# Patient Record
Sex: Male | Born: 1960 | Race: White | Hispanic: No | Smoking: Never smoker
Health system: Southern US, Community
[De-identification: ages and names within clinical notes are randomized; demographics above are authoritative.]

## PROBLEM LIST (undated history)

## (undated) DIAGNOSIS — E222 Syndrome of inappropriate secretion of antidiuretic hormone: Secondary | ICD-10-CM

## (undated) DIAGNOSIS — E119 Type 2 diabetes mellitus without complications: Secondary | ICD-10-CM

## (undated) DIAGNOSIS — I10 Essential (primary) hypertension: Secondary | ICD-10-CM

## (undated) DIAGNOSIS — N319 Neuromuscular dysfunction of bladder, unspecified: Secondary | ICD-10-CM

## (undated) DIAGNOSIS — E7800 Pure hypercholesterolemia, unspecified: Secondary | ICD-10-CM

## (undated) DIAGNOSIS — Z978 Presence of other specified devices: Secondary | ICD-10-CM

## (undated) DIAGNOSIS — G459 Transient cerebral ischemic attack, unspecified: Secondary | ICD-10-CM

## (undated) DIAGNOSIS — E46 Unspecified protein-calorie malnutrition: Secondary | ICD-10-CM

## (undated) DIAGNOSIS — G8220 Paraplegia, unspecified: Secondary | ICD-10-CM

---

## 2019-07-23 ENCOUNTER — Inpatient Hospital Stay
Admit: 2019-07-23 | Discharge: 2019-08-06 | Disposition: A | Discharge status: Other Acute Inpatient Hospital | Payer: Medicare HMO | Source: Other Acute Inpatient Hospital | Attending: Internal Medicine | Admitting: Internal Medicine

## 2019-07-24 LAB — COMPREHENSIVE METABOLIC PANEL
ALT: 12 U/L (ref 0–44)
AST: 19 U/L (ref 15–41)
Albumin: 1.4 g/dL — ABNORMAL LOW (ref 3.5–5.0)
Alkaline Phosphatase: 81 U/L (ref 38–126)
Anion gap: 7 (ref 5–15)
BUN: 10 mg/dL (ref 6–20)
CO2: 26 mmol/L (ref 22–32)
Calcium: 8.1 mg/dL — ABNORMAL LOW (ref 8.9–10.3)
Chloride: 104 mmol/L (ref 98–111)
Creatinine, Ser: 0.67 mg/dL (ref 0.61–1.24)
GFR calc Af Amer: >60 mL/min (ref >60)
GFR calc non Af Amer: >60 mL/min (ref >60)
Glucose, Bld: 140 mg/dL — ABNORMAL HIGH (ref 70–99)
Potassium: 3.4 mmol/L — ABNORMAL LOW (ref 3.5–5.1)
Sodium: 137 mmol/L (ref 135–145)
Total Bilirubin: 0.6 mg/dL (ref 0.3–1.2)
Total Protein: 5.3 g/dL — ABNORMAL LOW (ref 6.5–8.1)

## 2019-07-24 LAB — PROTIME-INR
INR: 1.1 (ref 0.8–1.2)
Prothrombin Time: 14 seconds (ref 11.4–15.2)

## 2019-07-24 MED ORDER — PRAVASTATIN SODIUM 40 MG PO TABS
40.00 | ORAL_TABLET | ORAL | Status: DC
Start: 2019-07-23 — End: 2019-07-24

## 2019-07-24 MED ORDER — INSULIN LISPRO 100 UNIT/ML ~~LOC~~ SOLN
1.00 | SUBCUTANEOUS | Status: DC
Start: 2019-07-23 — End: 2019-07-24

## 2019-07-24 MED ORDER — SODIUM CHLORIDE 0.9 % IV SOLN
10.00 | INTRAVENOUS | Status: DC
Start: ? — End: 2019-07-24

## 2019-07-24 MED ORDER — INSULIN LISPRO 100 UNIT/ML ~~LOC~~ SOLN
1.00 | SUBCUTANEOUS | Status: DC
Start: ? — End: 2019-07-24

## 2019-07-24 MED ORDER — PROMETHAZINE HCL 25 MG/ML IJ SOLN
12.50 | INTRAMUSCULAR | Status: DC
Start: ? — End: 2019-07-24

## 2019-07-24 MED ORDER — MORPHINE SULFATE (PF) 2 MG/ML IV SOLN
2.00 | INTRAVENOUS | Status: DC
Start: ? — End: 2019-07-24

## 2019-07-24 MED ORDER — CITRATE OF MAGNESIA PO SOLN
150.00 | ORAL | Status: DC
Start: ? — End: 2019-07-24

## 2019-07-24 MED ORDER — GENERIC EXTERNAL MEDICATION
Status: DC
Start: 2019-07-23 — End: 2019-07-24

## 2019-07-24 MED ORDER — GENERIC EXTERNAL MEDICATION
Status: DC
Start: ? — End: 2019-07-24

## 2019-07-24 MED ORDER — MAGNESIUM HYDROXIDE 400 MG/5ML PO SUSP
30.00 | ORAL | Status: DC
Start: ? — End: 2019-07-24

## 2019-07-24 MED ORDER — INSULIN GLARGINE 100 UNIT/ML ~~LOC~~ SOLN
1.00 | SUBCUTANEOUS | Status: DC
Start: 2019-07-23 — End: 2019-07-24

## 2019-07-24 MED ORDER — POLYETHYLENE GLYCOL 3350 17 GM/SCOOP PO POWD
17.00 | ORAL | Status: DC
Start: 2019-07-24 — End: 2019-07-24

## 2019-07-24 MED ORDER — GUAIFENESIN 100 MG/5ML PO SYRP
200.00 | ORAL_SOLUTION | ORAL | Status: DC
Start: ? — End: 2019-07-24

## 2019-07-24 MED ORDER — ASPIRIN 81 MG PO CHEW
81.00 | CHEWABLE_TABLET | ORAL | Status: DC
Start: 2019-07-24 — End: 2019-07-24

## 2019-07-24 MED ORDER — CLOTRIMAZOLE 1 % EX CREA
TOPICAL_CREAM | CUTANEOUS | Status: DC
Start: 2019-07-23 — End: 2019-07-24

## 2019-07-24 MED ORDER — ACETAMINOPHEN 325 MG PO TABS
650.00 | ORAL_TABLET | ORAL | Status: DC
Start: ? — End: 2019-07-24

## 2019-07-24 MED ORDER — GENERIC EXTERNAL MEDICATION
3.38 | Status: DC
Start: 2019-07-23 — End: 2019-07-24

## 2019-07-25 MED ORDER — GENERIC EXTERNAL MEDICATION
Status: DC
Start: ? — End: 2019-07-25

## 2019-07-26 LAB — CBC
HCT: 26.3 % — ABNORMAL LOW (ref 39.0–52.0)
Hemoglobin: 8.1 g/dL — ABNORMAL LOW (ref 13.0–17.0)
MCH: 26.4 pg (ref 26.0–34.0)
MCHC: 30.8 g/dL (ref 30.0–36.0)
MCV: 85.7 fL (ref 80.0–100.0)
Platelets: 172 10*3/uL (ref 150–400)
RBC: 3.07 MIL/uL — ABNORMAL LOW (ref 4.22–5.81)
RDW: 16.7 % — ABNORMAL HIGH (ref 11.5–15.5)
WBC: 4.8 10*3/uL (ref 4.0–10.5)
nRBC: 0 % (ref 0.0–0.2)

## 2019-07-27 MED ORDER — GENERIC EXTERNAL MEDICATION
Status: DC
Start: ? — End: 2019-07-27

## 2019-07-28 LAB — BASIC METABOLIC PANEL
Anion gap: 9 (ref 5–15)
BUN: 8 mg/dL (ref 6–20)
CO2: 28 mmol/L (ref 22–32)
Calcium: 8.2 mg/dL — ABNORMAL LOW (ref 8.9–10.3)
Chloride: 99 mmol/L (ref 98–111)
Creatinine, Ser: 0.54 mg/dL — ABNORMAL LOW (ref 0.61–1.24)
GFR calc Af Amer: >60 mL/min (ref >60)
GFR calc non Af Amer: >60 mL/min (ref >60)
Glucose, Bld: 276 mg/dL — ABNORMAL HIGH (ref 70–99)
Potassium: 3.4 mmol/L — ABNORMAL LOW (ref 3.5–5.1)
Sodium: 136 mmol/L (ref 135–145)

## 2019-07-28 LAB — HEMOGLOBIN A1C
Hgb A1c MFr Bld: 6.5 % — ABNORMAL HIGH (ref 4.8–5.6)
Mean Plasma Glucose: 139.85 mg/dL

## 2019-07-28 LAB — CBC
HCT: 26.9 % — ABNORMAL LOW (ref 39.0–52.0)
Hemoglobin: 8.4 g/dL — ABNORMAL LOW (ref 13.0–17.0)
MCH: 27.4 pg (ref 26.0–34.0)
MCHC: 31.2 g/dL (ref 30.0–36.0)
MCV: 87.6 fL (ref 80.0–100.0)
Platelets: 220 10*3/uL (ref 150–400)
RBC: 3.07 MIL/uL — ABNORMAL LOW (ref 4.22–5.81)
RDW: 16.7 % — ABNORMAL HIGH (ref 11.5–15.5)
WBC: 4.6 10*3/uL (ref 4.0–10.5)
nRBC: 0 % (ref 0.0–0.2)

## 2019-07-29 LAB — POTASSIUM: Potassium: 4.5 mmol/L (ref 3.5–5.1)

## 2019-07-29 MED ORDER — GENERIC EXTERNAL MEDICATION
Status: DC
Start: ? — End: 2019-07-29

## 2019-07-31 MED ORDER — GENERIC EXTERNAL MEDICATION
Status: DC
Start: ? — End: 2019-07-31

## 2019-08-02 MED ORDER — GENERIC EXTERNAL MEDICATION
Status: DC
Start: ? — End: 2019-08-02

## 2019-08-04 MED ORDER — GENERIC EXTERNAL MEDICATION
Status: DC
Start: ? — End: 2019-08-04

## 2019-08-05 LAB — CBC
HCT: 30.5 % — ABNORMAL LOW (ref 39.0–52.0)
Hemoglobin: 9.4 g/dL — ABNORMAL LOW (ref 13.0–17.0)
MCH: 27 pg (ref 26.0–34.0)
MCHC: 30.8 g/dL (ref 30.0–36.0)
MCV: 87.6 fL (ref 80.0–100.0)
Platelets: 312 10*3/uL (ref 150–400)
RBC: 3.48 MIL/uL — ABNORMAL LOW (ref 4.22–5.81)
RDW: 16.9 % — ABNORMAL HIGH (ref 11.5–15.5)
WBC: 7.2 10*3/uL (ref 4.0–10.5)
nRBC: 0 % (ref 0.0–0.2)

## 2019-08-05 LAB — HEMOGLOBIN AND HEMATOCRIT, BLOOD
HCT: 25.2 % — ABNORMAL LOW (ref 39.0–52.0)
Hemoglobin: 7.6 g/dL — ABNORMAL LOW (ref 13.0–17.0)

## 2019-08-05 LAB — ABO/RH: ABO/RH(D): O POS

## 2019-08-05 LAB — CULTURE, BLOOD (ROUTINE X 2)
Culture: NO GROWTH
Culture: NO GROWTH
Special Requests: ADEQUATE

## 2019-08-05 LAB — PROTIME-INR
INR: 1 (ref 0.8–1.2)
Prothrombin Time: 13.1 seconds (ref 11.4–15.2)

## 2019-08-05 LAB — PREPARE RBC (CROSSMATCH)

## 2019-08-06 ENCOUNTER — Inpatient Hospital Stay
Admission: RE | Admit: 2019-08-06 | Discharge: 2019-10-03 | Disposition: A | Discharge status: Skilled Nursing Facility | Payer: Medicare HMO | Source: Other Acute Inpatient Hospital | Attending: Internal Medicine | Admitting: Internal Medicine

## 2019-08-06 ENCOUNTER — Emergency Department (HOSPITAL_COMMUNITY)
Admission: EM | Admit: 2019-08-06 | Discharge: 2019-08-06 | Disposition: A | Discharge status: Home / Self Care | Payer: Medicare HMO | Attending: Emergency Medicine | Admitting: Emergency Medicine

## 2019-08-06 ENCOUNTER — Encounter (HOSPITAL_COMMUNITY): Payer: Self-pay | Admitting: Emergency Medicine

## 2019-08-06 DIAGNOSIS — L98425 Non-pressure chronic ulcer of back with muscle involvement without evidence of necrosis: Secondary | ICD-10-CM | POA: Diagnosis not present

## 2019-08-06 DIAGNOSIS — Z5189 Encounter for other specified aftercare: Secondary | ICD-10-CM | POA: Insufficient documentation

## 2019-08-06 DIAGNOSIS — D649 Anemia, unspecified: Secondary | ICD-10-CM | POA: Insufficient documentation

## 2019-08-06 DIAGNOSIS — T148XXA Other injury of unspecified body region, initial encounter: Secondary | ICD-10-CM

## 2019-08-06 DIAGNOSIS — L89154 Pressure ulcer of sacral region, stage 4: Secondary | ICD-10-CM

## 2019-08-06 DIAGNOSIS — E11622 Type 2 diabetes mellitus with other skin ulcer: Secondary | ICD-10-CM | POA: Insufficient documentation

## 2019-08-06 DIAGNOSIS — L03312 Cellulitis of back [any part except buttock]: Secondary | ICD-10-CM | POA: Insufficient documentation

## 2019-08-06 DIAGNOSIS — L02416 Cutaneous abscess of left lower limb: Secondary | ICD-10-CM

## 2019-08-06 DIAGNOSIS — L089 Local infection of the skin and subcutaneous tissue, unspecified: Secondary | ICD-10-CM

## 2019-08-06 DIAGNOSIS — Z95828 Presence of other vascular implants and grafts: Secondary | ICD-10-CM

## 2019-08-06 HISTORY — DX: Pure hypercholesterolemia, unspecified: E78.00

## 2019-08-06 HISTORY — DX: Unspecified protein-calorie malnutrition: E46

## 2019-08-06 HISTORY — DX: Syndrome of inappropriate secretion of antidiuretic hormone: E22.2

## 2019-08-06 HISTORY — DX: Paraplegia, unspecified: G82.20

## 2019-08-06 HISTORY — DX: Essential (primary) hypertension: I10

## 2019-08-06 HISTORY — DX: Neuromuscular dysfunction of bladder, unspecified: N31.9

## 2019-08-06 HISTORY — DX: Presence of other specified devices: Z97.8

## 2019-08-06 HISTORY — DX: Type 2 diabetes mellitus without complications: E11.9

## 2019-08-06 HISTORY — DX: Transient cerebral ischemic attack, unspecified: G45.9

## 2019-08-06 LAB — HEMOGLOBIN AND HEMATOCRIT, BLOOD
HCT: 23.2 % — ABNORMAL LOW (ref 39.0–52.0)
Hemoglobin: 7.2 g/dL — ABNORMAL LOW (ref 13.0–17.0)

## 2019-08-06 LAB — PREPARE RBC (CROSSMATCH)

## 2019-08-06 LAB — CBG MONITORING, ED
Glucose-Capillary: 321 mg/dL — ABNORMAL HIGH (ref 70–99)
Glucose-Capillary: 472 mg/dL — ABNORMAL HIGH (ref 70–99)

## 2019-08-06 MED ORDER — SODIUM CHLORIDE 0.9 % IV SOLN
10.0000 mL/h | Freq: Once | INTRAVENOUS | Status: AC
  Administered 2019-08-06: 17:00:00 10 mL/h via INTRAVENOUS

## 2019-08-06 MED ORDER — INSULIN ASPART 100 UNIT/ML ~~LOC~~ SOLN
12.0000 [IU] | Freq: Once | SUBCUTANEOUS | Status: AC
  Administered 2019-08-06: 19:00:00 12 [IU] via SUBCUTANEOUS

## 2019-08-06 MED ORDER — GENERIC EXTERNAL MEDICATION
Status: DC
Start: ? — End: 2019-08-06

## 2019-08-06 NOTE — ED Provider Notes (Signed)
[facility] EMERGENCY DEPARTMENT Provider Note   CSN: [id number] Arrival date & time: 08/06/19  1606     History Chief Complaint  Patient presents with  . Wound Check    Donald Graves is a 59 y.o. male obesity, paraplegia, DM2, chronic sacral decubitus ulcer complicated by soft tissue infection, abscesses, requiring surgical debridement in April 2021, sepsis treated with IV antibiotics, presenting today from his wound care clinic upstairs for bleeding issue.  He was seen in [facility] upstairs, where his packing was removed, and he immediately began having a small suspected arteriole bleed from his wound.  I spoke to the clinic physician who tells me that they attempted to cauterize the wound,and then placed 3 sutures (3-0 silk) over the wound to control the bleeding.  They laid him down to apply pressure to the site, and sent him downstairs.  He was PENDING a transfusion for 1 unit pRBC for his chronic anemia, which is being managed in the hospital, but did not receive it upstairs.  He most recently received a transfusion yesterday.  He is currently an inpatient at our hospital.  The patient denies new lightheadedness, CP, or SOB.  He is unaware of any prior issues of extensive wound bleeding but he has had a complicated history.  He is NOT on blood thinners.  HPI     Past Medical History:  Diagnosis Date  . Chronic indwelling Foley catheter   . Diabetes mellitus without complication (HCC)   . High cholesterol   . Hypertension   . Neurogenic bladder   . Paraplegia (HCC)   . Protein calorie malnutrition (HCC)   . SIADH (syndrome of inappropriate ADH production) (HCC)   . TIA (transient ischemic attack)     There are no problems to display for this patient.   History reviewed. No pertinent surgical history.     No family history on file.  Social History   Tobacco Use  . Smoking status: Never Smoker  . Smokeless tobacco: Never Used  Substance  Use Topics  . Alcohol use: Not Currently  . Drug use: Never    Home Medications Prior to Admission medications   Medication Sig Start Date End Date Taking? Authorizing Provider  ascorbic acid (VITAMIN C) 1000 MG tablet Take 1,000 mg by mouth every morning.   Yes [provider]  Bilberry 1000 MG CAPS Take 1,000 mg by mouth every morning.   Yes [provider]  clotrimazole (LOTRIMIN) 1 % cream Apply 1 application topically 2 (two) times daily. 07/23/19  Yes [provider]  cyanocobalamin 2000 MCG tablet Take 5,000 mcg by mouth every morning.   Yes [provider]  fenofibrate 160 MG tablet Take 160 mg by mouth daily. 04/11/19  Yes [provider]  Flaxseed, Linseed, (FLAX SEED OIL) 1000 MG CAPS Take 1,000 mg by mouth every morning.   Yes [provider]  glipiZIDE (GLUCOTROL) 10 MG tablet Take 10 mg by mouth daily. 04/12/19  Yes [provider]  lisinopril (ZESTRIL) 5 MG tablet Take 5 mg by mouth daily. 03/09/19  Yes [provider]  metFORMIN (GLUCOPHAGE) 1000 MG tablet Take 1,000 mg by mouth 2 (two) times daily. 05/18/19  Yes [provider]  pravastatin (PRAVACHOL) 80 MG tablet Take 80 mg by mouth at bedtime. 05/18/19  Yes [provider]  vitamin E 1000 UNIT capsule Take 1,000 Units by mouth every morning.   Yes [provider]    Allergies  Patient has no known allergies.  Review of Systems   Review of Systems  Constitutional: Negative for chills and fever.  Eyes: Negative for photophobia and visual disturbance.  Respiratory: Negative for cough and shortness of breath.   Cardiovascular: Negative for chest pain and palpitations.  Gastrointestinal: Negative for abdominal pain and vomiting.  Genitourinary: Negative for dysuria and hematuria.  Musculoskeletal: Positive for myalgias. Negative for arthralgias.  Skin: Positive for wound. Negative for pallor.  Neurological: Negative for  syncope and headaches.  All other systems reviewed and are negative.   Physical Exam Updated Vital Signs BP (!) 129/56   Pulse 94   Temp 99.6 F (37.6 C) (Oral)   Resp (!) 23   Ht 5\' 6"  (1.676 m)   Wt 110.1 kg   SpO2 100%   BMI 39.19 kg/m   Physical Exam Vitals and nursing note reviewed.  Constitutional:      Appearance: He is well-developed. He is obese.  HENT:     Head: Normocephalic and atraumatic.  Eyes:     Conjunctiva/sclera: Conjunctivae normal.  Cardiovascular:     Rate and Rhythm: Normal rate and regular rhythm.     Heart sounds: No murmur.  Pulmonary:     Effort: Pulmonary effort is normal. No respiratory distress.  Musculoskeletal:     Cervical back: Neck supple.  Skin:    General: Skin is warm and dry.     Comments: Wound as noted below, extensive region, no purulence or malodor No active bleeding from region of cauterization (sutures noted) in inferior aspect of wound  Neurological:     Mental Status: He is alert.     Top photo shows region of cauterization (after packing was removed) with sutures placed      ED Results / Procedures / Treatments   Labs (all labs ordered are listed, but only abnormal results are displayed) Labs Reviewed  CBG MONITORING, ED - Abnormal; Notable for the following components:      Result Value   Glucose-Capillary 321 (*)    All other components within normal limits  PREPARE RBC (CROSSMATCH)    EKG None  Radiology No results found.  Procedures Procedures (including critical care time)  Medications Ordered in ED Medications  0.9 %  sodium chloride infusion (10 mL/hr Intravenous New Bag/Given 08/06/19 1721)  insulin aspart (novoLOG) injection 12 Units (12 Units Subcutaneous Given 08/06/19 1833)    ED Course  I have reviewed the triage vital signs and the nursing notes.  Pertinent labs & imaging results that were available during my care of the patient were reviewed by me and considered in my medical  decision making (see chart for details).  59 yo male with complicated medical wound history as noted above, currently an inpatient at our LTAC on IV antibiotics, presenting from the wound clinic upstairs with concerns for possible arterial bleed.  Appears that the physician was able ot achieve hemostasis with cautery, pressure and sutures upstairs, as the patient is not actively bleeding now.  His vitals are otherwise stable.  He is not lightheaded.  He was scheduled for blood transfusion for his chronic anemia today, but does not receive it upstairs.  I think is reasonable for 41 to proceed with a transfusion down here.  Will reassess his wound after approximately 2 hours to ensure that is not bleeding any further.  Patient is not on A/C.  Location of the wound, my opinion does not appear to be close proximity to the major arteries  or veins of the leg.   His vascular surgeon is planning to evaluate him on Wednesday  Labs checked regularly in [facility] [place] facility, I do not believe we need repeat labs at this time   Additional history was provided by wound clinic by phone as noted above   Clinical Course as of Aug 06 1927  Mon Aug 06, 2019  1925 Wound reassessed.  No active bleeding.  The patient is finished his dinner.  He was given insulin prior to this.  He is finishing his transfusion now.  I think he is stable for discharge back upstairs to the LTAC.  If these were silk sutures, they will need reassessment and removal in likely 3-5 days, or sooner if his surgeon feels it is appropriate   [MT]    Clinical Course User Index [MT] Loletta Harper, Carola Rhine, MD   Final Clinical Impression(s) / ED Diagnoses Final diagnoses:  Visit for wound check  Bleeding from wound    Rx / DC Orders ED Discharge Orders    None       Wyvonnia Dusky, MD 08/06/19 1929

## 2019-08-06 NOTE — ED Notes (Signed)
CBG Results of 472 reported to [place], [place].

## 2019-08-06 NOTE — ED Notes (Signed)
Dr. Renaye Rakers at bedside. Pt sacral wound check, no bleeding noted. Dressing placed over wound.

## 2019-08-06 NOTE — Discharge Instructions (Signed)
Donald Graves was sent to the ER with concern for a possible small arteriole bleed in his left thigh wound.  This was sutured in the wound clinic, reportedly with 3-0 silk sutures.  He was not bleeding on arrival in the ED, and after 3 hours of observation, he had no bleeding.  I felt he was stable for discharge upstairs.  He did receive 1 unit pRBC here as was scheduled in his wound clinic for his chronic anemia.    His wound care doctor or vascular surgeon should reassess this wound tomorrow.  The packing over the bleeding site (cauterized site) was reapplied, and the wound was covered with clean padding.  The sutures will likely need to be removed in 3-5 days, or sooner if his surgeon believes so.

## 2019-08-06 NOTE — ED Notes (Signed)
Transfuse 1 pRBC, reassess @ 630 pm Dinner ordered

## 2019-08-06 NOTE — ED Triage Notes (Signed)
Patient brought down from [facility], sent down for assessment of sacral wound. Per staff difficulty with bleeding control of wound earlier in the day. Paraplegic and bed bound. A/OX4.

## 2019-08-06 NOTE — ED Notes (Signed)
[facility] RN notified of pt CBG result.

## 2019-08-07 LAB — TYPE AND SCREEN
ABO/RH(D): O POS
Antibody Screen: NEGATIVE
Unit division: 0
Unit division: 0

## 2019-08-07 LAB — BPAM RBC
Blood Product Expiration Date: 202106102359
Blood Product Expiration Date: 202106112359
ISSUE DATE / TIME: 202105091553
ISSUE DATE / TIME: 202105101707
Unit Type and Rh: 5100
Unit Type and Rh: 5100

## 2019-08-08 LAB — BASIC METABOLIC PANEL
Anion gap: 11 (ref 5–15)
BUN: 10 mg/dL (ref 6–20)
CO2: 27 mmol/L (ref 22–32)
Calcium: 8.3 mg/dL — ABNORMAL LOW (ref 8.9–10.3)
Chloride: 93 mmol/L — ABNORMAL LOW (ref 98–111)
Creatinine, Ser: 0.8 mg/dL (ref 0.61–1.24)
GFR calc Af Amer: >60 mL/min (ref >60)
GFR calc non Af Amer: >60 mL/min (ref >60)
Glucose, Bld: 318 mg/dL — ABNORMAL HIGH (ref 70–99)
Potassium: 3.7 mmol/L (ref 3.5–5.1)
Sodium: 131 mmol/L — ABNORMAL LOW (ref 135–145)

## 2019-08-08 LAB — HEMOGLOBIN AND HEMATOCRIT, BLOOD
HCT: 25.4 % — ABNORMAL LOW (ref 39.0–52.0)
Hemoglobin: 7.7 g/dL — ABNORMAL LOW (ref 13.0–17.0)

## 2019-08-09 LAB — CBC
HCT: 26.5 % — ABNORMAL LOW (ref 39.0–52.0)
Hemoglobin: 8 g/dL — ABNORMAL LOW (ref 13.0–17.0)
MCH: 27 pg (ref 26.0–34.0)
MCHC: 30.2 g/dL (ref 30.0–36.0)
MCV: 89.5 fL (ref 80.0–100.0)
Platelets: 193 10*3/uL (ref 150–400)
RBC: 2.96 MIL/uL — ABNORMAL LOW (ref 4.22–5.81)
RDW: 16.2 % — ABNORMAL HIGH (ref 11.5–15.5)
WBC: 4.6 10*3/uL (ref 4.0–10.5)
nRBC: 0 % (ref 0.0–0.2)

## 2019-08-09 LAB — BASIC METABOLIC PANEL
Anion gap: 14 (ref 5–15)
BUN: 10 mg/dL (ref 6–20)
CO2: 27 mmol/L (ref 22–32)
Calcium: 8.4 mg/dL — ABNORMAL LOW (ref 8.9–10.3)
Chloride: 95 mmol/L — ABNORMAL LOW (ref 98–111)
Creatinine, Ser: 0.69 mg/dL (ref 0.61–1.24)
GFR calc Af Amer: >60 mL/min (ref >60)
GFR calc non Af Amer: >60 mL/min (ref >60)
Glucose, Bld: 204 mg/dL — ABNORMAL HIGH (ref 70–99)
Potassium: 3.6 mmol/L (ref 3.5–5.1)
Sodium: 136 mmol/L (ref 135–145)

## 2019-08-16 LAB — BASIC METABOLIC PANEL
Anion gap: 13 (ref 5–15)
BUN: 16 mg/dL (ref 6–20)
CO2: 24 mmol/L (ref 22–32)
Calcium: 8.2 mg/dL — ABNORMAL LOW (ref 8.9–10.3)
Chloride: 89 mmol/L — ABNORMAL LOW (ref 98–111)
Creatinine, Ser: 0.73 mg/dL (ref 0.61–1.24)
GFR calc Af Amer: >60 mL/min (ref >60)
GFR calc non Af Amer: >60 mL/min (ref >60)
Glucose, Bld: 417 mg/dL — ABNORMAL HIGH (ref 70–99)
Potassium: 3.7 mmol/L (ref 3.5–5.1)
Sodium: 126 mmol/L — ABNORMAL LOW (ref 135–145)

## 2019-08-16 LAB — CBC
HCT: 24.6 % — ABNORMAL LOW (ref 39.0–52.0)
Hemoglobin: 7.6 g/dL — ABNORMAL LOW (ref 13.0–17.0)
MCH: 26.4 pg (ref 26.0–34.0)
MCHC: 30.9 g/dL (ref 30.0–36.0)
MCV: 85.4 fL (ref 80.0–100.0)
Platelets: 211 10*3/uL (ref 150–400)
RBC: 2.88 MIL/uL — ABNORMAL LOW (ref 4.22–5.81)
RDW: 15.4 % (ref 11.5–15.5)
WBC: 5.2 10*3/uL (ref 4.0–10.5)
nRBC: 0 % (ref 0.0–0.2)

## 2019-08-17 LAB — URINALYSIS, ROUTINE W REFLEX MICROSCOPIC
Bilirubin Urine: NEGATIVE
Glucose, UA: >=500 mg/dL — AB
Hgb urine dipstick: NEGATIVE
Ketones, ur: NEGATIVE mg/dL
Nitrite: NEGATIVE
Protein, ur: NEGATIVE mg/dL
Specific Gravity, Urine: 1.018 (ref 1.005–1.030)
WBC, UA: >50 WBC/hpf — ABNORMAL HIGH (ref 0–5)
pH: 5 (ref 5.0–8.0)

## 2019-08-18 ENCOUNTER — Institutional Professional Consult (permissible substitution) (HOSPITAL_COMMUNITY): Payer: Medicare HMO

## 2019-08-18 LAB — BASIC METABOLIC PANEL
Anion gap: 10 (ref 5–15)
BUN: 13 mg/dL (ref 6–20)
CO2: 26 mmol/L (ref 22–32)
Calcium: 8.5 mg/dL — ABNORMAL LOW (ref 8.9–10.3)
Chloride: 95 mmol/L — ABNORMAL LOW (ref 98–111)
Creatinine, Ser: 0.56 mg/dL — ABNORMAL LOW (ref 0.61–1.24)
GFR calc Af Amer: >60 mL/min (ref >60)
GFR calc non Af Amer: >60 mL/min (ref >60)
Glucose, Bld: 243 mg/dL — ABNORMAL HIGH (ref 70–99)
Potassium: 3.5 mmol/L (ref 3.5–5.1)
Sodium: 131 mmol/L — ABNORMAL LOW (ref 135–145)

## 2019-08-18 LAB — CBC
HCT: 25 % — ABNORMAL LOW (ref 39.0–52.0)
Hemoglobin: 7.8 g/dL — ABNORMAL LOW (ref 13.0–17.0)
MCH: 26.6 pg (ref 26.0–34.0)
MCHC: 31.2 g/dL (ref 30.0–36.0)
MCV: 85.3 fL (ref 80.0–100.0)
Platelets: 221 10*3/uL (ref 150–400)
RBC: 2.93 MIL/uL — ABNORMAL LOW (ref 4.22–5.81)
RDW: 15.5 % (ref 11.5–15.5)
WBC: 4.2 10*3/uL (ref 4.0–10.5)
nRBC: 0 % (ref 0.0–0.2)

## 2019-08-18 MED ORDER — IOHEXOL 300 MG/ML  SOLN
100.0000 mL | Freq: Once | INTRAMUSCULAR | Status: AC | PRN
  Administered 2019-08-18: 100 mL via INTRAVENOUS

## 2019-08-19 LAB — URINE CULTURE: Culture: 20000 — AB

## 2019-08-19 LAB — VANCOMYCIN, TROUGH: Vancomycin Tr: 14 ug/mL — ABNORMAL LOW (ref 15–20)

## 2019-08-21 DIAGNOSIS — L89154 Pressure ulcer of sacral region, stage 4: Secondary | ICD-10-CM

## 2019-08-21 NOTE — Consult Note (Signed)
ORTHOPAEDIC CONSULTATION  REQUESTING PHYSICIAN: Carron Curie, MD  Chief Complaint: Massive sacral decubitus ulcer.  HPI: Donald Graves is a 59 y.o. male who presents with a massive decubitus ulcer.  Patient is paraplegic he has undergone surgical intervention in [place] for this ulcer currently was treated with a wound VAC.  Past Medical History:  Diagnosis Date  . Chronic indwelling Foley catheter   . Diabetes mellitus without complication (HCC)   . High cholesterol   . Hypertension   . Neurogenic bladder   . Paraplegia (HCC)   . Protein calorie malnutrition (HCC)   . SIADH (syndrome of inappropriate ADH production) (HCC)   . TIA (transient ischemic attack)    No past surgical history on file. Social History   Socioeconomic History  . Marital status: Unknown    Spouse name: Not on file  . Number of children: Not on file  . Years of education: Not on file  . Highest education level: Not on file  Occupational History  . Not on file  Tobacco Use  . Smoking status: Never Smoker  . Smokeless tobacco: Never Used  Substance and Sexual Activity  . Alcohol use: Not Currently  . Drug use: Never  . Sexual activity: Not on file  Other Topics Concern  . Not on file  Social History Narrative  . Not on file   Social Determinants of Health   Financial Resource Strain:   . Difficulty of Paying Living Expenses:   Food Insecurity:   . Worried About Programme researcher, broadcasting/film/video in the Last Year:   . Barista in the Last Year:   Transportation Needs:   . Freight forwarder (Medical):   Marland Kitchen Lack of Transportation (Non-Medical):   Physical Activity:   . Days of Exercise per Week:   . Minutes of Exercise per Session:   Stress:   . Feeling of Stress :   Social Connections:   . Frequency of Communication with Friends and Family:   . Frequency of Social Gatherings with Friends and Family:   . Attends Religious Services:   . Active Member of Clubs or Organizations:   .  Attends Banker Meetings:   Marland Kitchen Marital Status:    No family history on file. - negative except otherwise stated in the family history section No Known Allergies Prior to Admission medications   Medication Sig Start Date End Date Taking? Authorizing Provider  ascorbic acid (VITAMIN C) 1000 MG tablet Take 1,000 mg by mouth every morning.    [provider]  Bilberry 1000 MG CAPS Take 1,000 mg by mouth every morning.    [provider]  clotrimazole (LOTRIMIN) 1 % cream Apply 1 application topically 2 (two) times daily. 07/23/19   [provider]  cyanocobalamin 2000 MCG tablet Take 5,000 mcg by mouth every morning.    [provider]  fenofibrate 160 MG tablet Take 160 mg by mouth daily. 04/11/19   [provider]  Flaxseed, Linseed, (FLAX SEED OIL) 1000 MG CAPS Take 1,000 mg by mouth every morning.    [provider]  glipiZIDE (GLUCOTROL) 10 MG tablet Take 10 mg by mouth daily. 04/12/19   [provider]  lisinopril (ZESTRIL) 5 MG tablet Take 5 mg by mouth daily. 03/09/19   [provider]  metFORMIN (GLUCOPHAGE) 1000 MG tablet Take 1,000 mg by mouth 2 (two) times daily. 05/18/19   [provider]  pravastatin (PRAVACHOL) 80 MG tablet Take 80  mg by mouth at bedtime. 05/18/19   [provider]  vitamin E 1000 UNIT capsule Take 1,000 Units by mouth every morning.    [provider]   No results found. - pertinent xrays, CT, MRI studies were reviewed and independently interpreted  Positive ROS: All other systems have been reviewed and were otherwise negative with the exception of those mentioned in the HPI and as above.  Physical Exam: General: Alert, no acute distress Psychiatric: Patient is competent for consent with normal mood and affect Lymphatic: No axillary or cervical lymphadenopathy Cardiovascular: No pedal edema Respiratory: No cyanosis, no use of accessory musculature GI:  No organomegaly, abdomen is soft and non-tender    Images:  @ENCIMAGES @  Labs:  Lab Results  Component Value Date   HGBA1C 6.5 (H) 07/28/2019   REPTSTATUS 08/19/2019 FINAL 08/17/2019   CULT 20,000 COLONIES/mL YEAST (A) 08/17/2019    Lab Results  Component Value Date   ALBUMIN 1.4 (L) 07/24/2019    Neurologic: Patient does not have protective sensation bilateral lower extremities.   MUSCULOSKELETAL:   Skin: The photographs were examined which showed some necrotic tissue deep within the wound.  The remainder of the wound has healthy granulation tissue.  The wound edges are healthy and viable.  Review of the CT scan shows ulcer extending down to the trochanter of the hip as well as the ulcer extending to the ischial tuberosity.  There are bony changes in both.  The extent of the previous surgical debridement is unknown the bone changes could the surgical however the wound does extend to the depth of the ulcer.  Assessment: Assessment: Paraplegia with massive sacral decubitus ulcer that extends down to the ischial tuberosity as well as the trochanter of the left hip.  Plan: Plan: Would recommend installation wound VAC therapy with the cleanse choice dressing with instillation therapy change 3 times a week.  This should debride the necrotic tissue that is deep within the wound.  This should provide granulation coverage over the bone and with the IV antibiotics that should resolve deep infection.  Recommend pressure offloading.  Would also obtain consultation from [facility] plastic surgery.  Surgical treatment for this wound is out of the scope of my practice.  Thank you for the consult and the opportunity to see Donald Graves, [place] [phone] 7:51 AM

## 2019-08-22 LAB — CULTURE, BLOOD (ROUTINE X 2)
Culture: NO GROWTH
Culture: NO GROWTH
Special Requests: ADEQUATE
Special Requests: ADEQUATE

## 2019-08-22 NOTE — Progress Notes (Signed)
IR requested by Dr. Sharyon Medicus for possible image-guided left hip aspiration.  Case/images reviewed by Dr. Fredia Sorrow who state there is no abscess of fluid collection (only contiguous inflammation), therefore nothing to aspirate. In addition, states the posterior wound goes right into the hip join and a wound swab would probably give an organism. No plans for IR intervention at this time, will delete order. Dr. Sharyon Medicus made aware.  IR available in future if needed.   Waylan Boga Dorathy Stallone, PA-C 08/22/2019, 1:57 PM

## 2019-08-23 NOTE — Consult Note (Signed)
Infectious Disease Consultation   Donald Graves  JXB:[id number]  DOB: 16-Mar-1961  DOA: 08/06/2019  Requesting physician: Dr. Sharyon Medicus  Reason for consultation: Antibiotic recommendations   History of Present Illness: Donald Graves is an 59 y.o. male with history of paraplegia, chronic Foley catheter, sacral decubitus ulcer, diabetes mellitus type 2, hypertension who initially presented to the acute facility on 06/29/2019 with worsening sacral decubitus ulcer and fatigue.  Patient was found to be septic, fever 102 with elevated leukocytosis, elevated procalcitonin.  He was also hypotensive.  He was found to have necrotizing sacral wound infection, gram-negative bacteremia.  His blood cultures showed strep viridans, Corynebacterium, Enterococcus and anaerobes.  There was also suspicion for osteomyelitis.  He underwent multiple debridements during his hospital stay on 06/30/2019, 07/10/2019, 4/15 and 4/22.  Infectious disease was consulted for the gram-negative bacteremia and multiple other organisms on the wound cultures including anaerobes. Blood cultures 4/2 one set with Eggerthella lenta, the other with Clostridium hastiforme and Parvimonas micra. UA had >100 WBCs, culture with a sensitive E.coli. He was had an echo that did not reveal any vegetations. He continued to have fevers and underwent repeat blood cultures that were unrevealing. On 4/13, he underwent debridement and diverting colostomy.  Per operative report there was multiple pus pockets and complex necrotic wound with some necrotic fascia, debridement left 30 x 30 cm wound with visible sacrum and posterior wall of rectum and anus. He went back to the OR 4/15 for excision of left trochanteric pressure ulcer and excision of sacral ulcer in preparation for later flap or skin graft.He underwent another surgery on 4/15 and then on 4/22 with further debridement and overall improvement. A PICC line was placed. He was initially on broad  antibiotics with Vanc and Zosyn which was narrowed to monotherapy with Zosyn only. Repeat fever later in his course, IV vancomycin was added, initiated a work up of cultures again and ID was reconsulted. Work up unrevealing and therefore Vancomycin was discontinued.  The plan was to continue treatment with IV Zosyn until 08/10/2019.  After continue treatment with IV Zosyn he was noted to have a fever of 101.  He had CT of the pelvis with contrast on 08/18/2019 which per report showing large decubitus ulcer with areas of bony resorption around the lesser trochanter as well as posterior aspect of the ischial tuberosity suspicious for osteomyelitis, fluid collection surrounding the proximal left femur likely septic joint.  Interventional radiology was consulted.  Per IR there was not enough fluid to be drained.  He is now restarted on IV vancomycin, Zosyn.  He denies having any shortness of breath, chest pain, nausea, vomiting.  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  Review of Systems:  Review of systems negative except as mentioned above in the HPI.   Past Medical History: Past Medical History:  Diagnosis Date  . Chronic indwelling Foley catheter   . High cholesterol   . Hypertension   . Neurogenic bladder   . Paraplegia (HCC)   . Protein calorie malnutrition (HCC)   . SIADH (syndrome of inappropriate ADH production) (HCC)   . TIA (transient ischemic attack)   Diabetes mellitus Decubitus ulcer of sacral region, stage 2 (*) 05/03/2016  . Fractures 03/08/86  back  . Hiatal hernia  MILD  . Hx of wisdom tooth extraction  . Hyperlipidemia  . Hypertension  . IBS (irritable bowel syndrome)  . Paralysis (*)  .  Pressure ulcer, unspecified site(707.00)  . Seasonal allergies  . Tendency toward bleeding easily (*)  . Urinary catheter in place  16 FR  . UTI (urinary tract infection)   Past Surgical History: . Back surgery  . Colonoscopy N/A 07/31/2015  Procedure:  COLONOSCOPY; Surgeon: Azalia Bilis, MD; Location: [facility]; Service: Gastroenterology; Laterality: N/A;  . 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 Lesser toe amputation 2012    Allergies:  No Known Allergies   Social History:  reports that he has never smoked. He has never used smokeless tobacco. He reports previous alcohol use. He reports that he does not use drugs.   Family History: . Diabetes Father  . Colon polyps Father  . Diabetes Sister  . Diabetes Paternal Grandmother  . Heart disease Paternal Grandfather  mi  . Diabetes Paternal Grandfather    Physical Exam: Vitals: Temperature 98.1, pulse 80, respiratory 19, blood pressure 122/72, pulse oximetry 99% on room air Constitutional: Alert and awake, oriented x3, not in any acute distress. Eyes: PERLA, EOMI, irises appear normal, anicteric sclera,  ENMT: external ears and nose appear normal, normal hearing, lips appears normal, moist oral mucosa tongue, posterior pharynx appear normal  Neck: neck appears normal, no masses, normal ROM CVS: S1-S2 clear, no murmur Respiratory: Decreased breath sound lower lobes otherwise clear to auscultation Abdomen: Morbidly obese, soft, positive bowel sounds, has ostomy Musculoskeletal: He is paraplegic from his waist down, no lower extremity edema Neuro: Paraplegic from his waist down, also has some upper extremity weakness Psych: judgement and insight appear normal, stable mood and affect, mental status Skin: no rashes  Data reviewed:  I have personally reviewed following labs and imaging studies Labs:  CBC: Recent Labs  Lab 08/18/19 0644  WBC 4.2  HGB 7.8*  HCT 25.0*  MCV 85.3  PLT 481    Basic Metabolic Panel: Recent Labs  Lab 08/18/19 0644  NA 131*  K 3.5  CL 95*  CO2 26  GLUCOSE 243*  BUN 13  CREATININE 0.56*  CALCIUM 8.5*   GFR CrCl cannot be calculated (Unknown ideal weight.). Liver Function Tests: No results for input(s): AST, ALT, ALKPHOS, BILITOT, PROT,  ALBUMIN in the last 168 hours. No results for input(s): LIPASE, AMYLASE in the last 168 hours. No results for input(s): AMMONIA in the last 168 hours. Coagulation profile No results for input(s): INR, PROTIME in the last 168 hours.  Cardiac Enzymes: No results for input(s): CKTOTAL, CKMB, CKMBINDEX, TROPONINI in the last 168 hours. BNP: Invalid input(s): POCBNP CBG: No results for input(s): GLUCAP in the last 168 hours. D-Dimer No results for input(s): DDIMER in the last 72 hours. Hgb A1c No results for input(s): HGBA1C in the last 72 hours. Lipid Profile No results for input(s): CHOL, HDL, LDLCALC, TRIG, CHOLHDL, LDLDIRECT in the last 72 hours. Thyroid function studies No results for input(s): TSH, T4TOTAL, T3FREE, THYROIDAB in the last 72 hours.  Invalid input(s): FREET3 Anemia work up No results for input(s): VITAMINB12, FOLATE, FERRITIN, TIBC, IRON, RETICCTPCT in the last 72 hours. Urinalysis    Component Value Date/Time   COLORURINE YELLOW 08/17/2019 2000   APPEARANCEUR CLOUDY (A) 08/17/2019 2000   LABSPEC 1.018 08/17/2019 2000   PHURINE 5.0 08/17/2019 2000   GLUCOSEU >=500 (A) 08/17/2019 2000   HGBUR NEGATIVE 08/17/2019 2000   BILIRUBINUR NEGATIVE 08/17/2019 2000   KETONESUR NEGATIVE 08/17/2019 2000   PROTEINUR NEGATIVE 08/17/2019 2000   NITRITE NEGATIVE 08/17/2019 2000   LEUKOCYTESUR LARGE (A) 08/17/2019 2000  Microbiology Recent Results (from the past 240 hour(s))  Culture, blood (routine x 2)     Status: None   Collection Time: 08/17/19  2:27 AM   Specimen: BLOOD  Result Value Ref Range Status   Specimen Description BLOOD LEFT ARM  Final   Special Requests   Final    BOTTLES DRAWN AEROBIC AND ANAEROBIC Blood Culture adequate volume   Culture   Final    NO GROWTH 5 DAYS Performed at [facility], [address], [place], [place] [address]    Report Status 08/22/2019 FINAL  Final  Culture, blood (routine x 2)     Status: None   Collection Time:  08/17/19  2:27 AM   Specimen: BLOOD  Result Value Ref Range Status   Specimen Description BLOOD RIGHT ARM  Final   Special Requests   Final    BOTTLES DRAWN AEROBIC AND ANAEROBIC Blood Culture adequate volume   Culture   Final    NO GROWTH 5 DAYS Performed at [facility], [address] [place], [place] [address]    Report Status 08/22/2019 FINAL  Final  Urine Culture     Status: Abnormal   Collection Time: 08/17/19  7:46 PM   Specimen: Urine, Random  Result Value Ref Range Status   Specimen Description URINE, RANDOM  Final   Special Requests   Final    NONE Performed at [facility], [address] [place], [place] [address]    Culture 20,000 COLONIES/mL YEAST (A)  Final   Report Status 08/19/2019 FINAL  Final       Inpatient Medications:   Scheduled Meds: Please see MAR  Radiological Exams on Admission: No results found.  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  Likely extension of the sacral pressure ulcer down to the trochanter of the hip and the ischial tuberosity. He recently completed treatment with IV Zosyn.  He was having fever after completing treatment with IV Zosyn.  Orthopedics was consulted.  They recommended installation wound VAC therapy to debride the necrotic tissue.  IR was also consulted.  Unfortunately not enough fluid to drain.  Agree with obtaining wound swab cultures.  Currently started on IV vancomycin, Zosyn.  Recommend to continue current antibiotics until we have the wound culture results and adjust antibiotics accordingly.  If no evidence of  MRSA would recommend to discontinue the IV vancomycin. We will plan to treat for tentative duration of 3-4  Weeks pending improvement, may need longer because of high suspicion for osteomyelitis.  Sacral osteomyelitis: Patient had infected/necrotic stage IV sacral decubitus pressure ulcer and had multiple debridements at the outside facility.  He already completed treatment with prolonged IV Zosyn.  Now having fevers again.  CT imaging concerning for septic joint of the left hip/femur with concern for osteomyelitis P antibiotics and plan as mentioned above.  Continue local wound care.  Has a wound VAC in place.  Fever: Likely secondary to the septic joint.  Antibiotics as mentioned above.  Continue to monitor.  Please monitor BUN/trending very closely while on antibiotics and adjust dose accordingly.  Would also suggest IV hydration as needed to prevent patient from developing renal insufficiency.  UTI: UA showing evidence of UTI.  On antibiotics as mentioned above.  Follow-up on urine cultures and adjust antibiotics accordingly.  Recent sepsis: Patient had recent sepsis with shock at the outside facility.  Currently shock is resolved.  He is however, high risk for recurrent sepsis because of his wound, septic joint.  Blood cultures have been ordered.  His previous cultures revealed multiple organisms along with anaerobes.  He already completed treatment with 4 weeks of IV Zosyn.  Currently started on IV vancomycin, Zosyn.  Plan as mentioned above.  Follow-up on the blood cultures and adjust antibiotics accordingly.  If his blood cultures are positive we may need to consider removing the PICC line and give a line holiday.  Diabetes mellitus type 2: Continue to monitor Accu-Cheks, management of diabetes per primary team.  He will need proper glycemic control in order to enable wound healing.  Paraplegia with generalized weakness/debility: Continue supportive management per the primary team,  PT/OT.  Protein calorie malnutrition: Management per primary team.  Due to his complex medical problems he is high risk for worsening and decompensation.  Thank you for this consultation.  Plan of care discussed with the primary team and pharmacy.  Vonzella Nipple M.D. 08/23/2019, 4:02 PM

## 2019-08-24 LAB — BASIC METABOLIC PANEL
Anion gap: 9 (ref 5–15)
BUN: 8 mg/dL (ref 6–20)
CO2: 27 mmol/L (ref 22–32)
Calcium: 8.2 mg/dL — ABNORMAL LOW (ref 8.9–10.3)
Chloride: 100 mmol/L (ref 98–111)
Creatinine, Ser: 0.47 mg/dL — ABNORMAL LOW (ref 0.61–1.24)
GFR calc Af Amer: >60 mL/min (ref >60)
GFR calc non Af Amer: >60 mL/min (ref >60)
Glucose, Bld: 173 mg/dL — ABNORMAL HIGH (ref 70–99)
Potassium: 3.5 mmol/L (ref 3.5–5.1)
Sodium: 136 mmol/L (ref 135–145)

## 2019-08-24 LAB — CBC
HCT: 25.8 % — ABNORMAL LOW (ref 39.0–52.0)
Hemoglobin: 7.7 g/dL — ABNORMAL LOW (ref 13.0–17.0)
MCH: 25.8 pg — ABNORMAL LOW (ref 26.0–34.0)
MCHC: 29.8 g/dL — ABNORMAL LOW (ref 30.0–36.0)
MCV: 86.3 fL (ref 80.0–100.0)
Platelets: 265 10*3/uL (ref 150–400)
RBC: 2.99 MIL/uL — ABNORMAL LOW (ref 4.22–5.81)
RDW: 15.7 % — ABNORMAL HIGH (ref 11.5–15.5)
WBC: 5 10*3/uL (ref 4.0–10.5)
nRBC: 0 % (ref 0.0–0.2)

## 2019-08-24 LAB — VANCOMYCIN, TROUGH: Vancomycin Tr: 16 ug/mL (ref 15–20)

## 2019-08-25 ENCOUNTER — Other Ambulatory Visit (HOSPITAL_COMMUNITY): Payer: Medicare HMO

## 2019-08-28 LAB — BASIC METABOLIC PANEL
Anion gap: 15 (ref 5–15)
BUN: 10 mg/dL (ref 6–20)
CO2: 26 mmol/L (ref 22–32)
Calcium: 8.6 mg/dL — ABNORMAL LOW (ref 8.9–10.3)
Chloride: 98 mmol/L (ref 98–111)
Creatinine, Ser: 0.53 mg/dL — ABNORMAL LOW (ref 0.61–1.24)
GFR calc Af Amer: >60 mL/min (ref >60)
GFR calc non Af Amer: >60 mL/min (ref >60)
Glucose, Bld: 185 mg/dL — ABNORMAL HIGH (ref 70–99)
Potassium: 3.6 mmol/L (ref 3.5–5.1)
Sodium: 139 mmol/L (ref 135–145)

## 2019-08-28 LAB — CBC
HCT: 26.4 % — ABNORMAL LOW (ref 39.0–52.0)
Hemoglobin: 8 g/dL — ABNORMAL LOW (ref 13.0–17.0)
MCH: 26.1 pg (ref 26.0–34.0)
MCHC: 30.3 g/dL (ref 30.0–36.0)
MCV: 86 fL (ref 80.0–100.0)
Platelets: 234 10*3/uL (ref 150–400)
RBC: 3.07 MIL/uL — ABNORMAL LOW (ref 4.22–5.81)
RDW: 15.9 % — ABNORMAL HIGH (ref 11.5–15.5)
WBC: 5.5 10*3/uL (ref 4.0–10.5)
nRBC: 0 % (ref 0.0–0.2)

## 2019-08-29 LAB — VANCOMYCIN, TROUGH: Vancomycin Tr: 19 ug/mL (ref 15–20)

## 2019-08-31 LAB — SEDIMENTATION RATE: Sed Rate: 137 mm/hr — ABNORMAL HIGH (ref 0–16)

## 2019-08-31 NOTE — Progress Notes (Signed)
PROGRESS NOTE    Donald Graves  KNL:[id number] DOB: 11/05/1960 DOA: 08/06/2019   Brief Narrative:  Donald Graves is an 59 y.o. male with history of paraplegia, chronic Foley catheter, sacral decubitus ulcer, diabetes mellitus type 2, hypertension who initially presented to the acute facility on 06/29/2019 with worsening sacral decubitus ulcer and fatigue.  Patient was found to be septic, fever 102 with elevated leukocytosis, elevated procalcitonin.  He was also hypotensive.  He was found to have necrotizing sacral wound infection, gram-negative bacteremia.  His blood cultures showed strep viridans, Corynebacterium, Enterococcus and anaerobes. There was also suspicion for osteomyelitis.  He underwent multiple debridements during his hospital stay on 06/30/2019, 07/10/2019, 4/15 and 4/22.  Infectious disease was consulted for the gram-negative bacteremia and multiple other organisms on the wound cultures including anaerobes. Blood cultures 4/2 one set with Eggerthella lenta, the other with Clostridium hastiforme and Parvimonas micra. UA had >100 WBCs, culture with a sensitive E.coli. He was had an echo that did not reveal any vegetations. He continued to have fevers and underwent repeat blood cultures that were unrevealing. On 4/13, he underwent debridement and diverting colostomy.  Per operative report there was multiple pus pockets and complex necrotic wound with some necrotic fascia, debridement left 30 x 30 cm wound with visible sacrum and posterior wall of rectum and anus. He went back to the OR 4/15 for excision of left trochanteric pressure ulcer and excision of sacral ulcer in preparation for later flap or skin graft.He underwent another surgery on 4/15 and then on 4/22 with further debridement and overall improvement. A PICC line was placed. He was initially on broad antibiotics with Vanc and Zosyn which was narrowed to monotherapy with Zosyn only. Repeat fever later in his course, IV vancomycin was  added, initiated a work up of cultures again and ID was reconsulted. Work up unrevealing and therefore Vancomycin was discontinued.  The plan was to continue treatment with IV Zosyn until 08/10/2019.  After treatment with IV Zosyn he was noted to have a fever of 101.  He had CT of the pelvis with contrast on 08/18/2019 which per report showing large decubitus ulcer with areas of bony resorption around the lesser trochanter as well as posterior aspect of the ischial tuberosity suspicious for osteomyelitis, fluid collection surrounding the proximal left femur likely septic joint.  Interventional radiology was consulted.  Per IR there was not enough fluid to be drained.  He is now restarted on IV vancomycin, Zosyn.  He denies having any shortness of breath, chest pain, nausea, vomiting.  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  Likely extension of the sacral pressure ulcer down to the trochanter of the hip and the ischial tuberosity. He was having fever after completing treatment with IV Zosyn.  Orthopedics was consulted.  They recommended installation wound VAC therapy to debride the necrotic tissue.  IR was also consulted.  Unfortunately not enough fluid to drain.  Agree with obtaining wound swab cultures.  Currently started on IV vancomycin, Zosyn.  Recommend to continue current antibiotics for another 2 weeks.  If no evidence of MRSA  would recommend to discontinue the IV vancomycin. We will plan to treat for tentative duration of total 4-6 weeks pending  improvement, may need longer because of high suspicion for osteomyelitis. -Recommend to repeat CT scan in the next 7-10 days to evaluate for improvement.  Sacral osteomyelitis: Patient had infected/necrotic stage IV sacral decubitus pressure ulcer and had multiple debridements at the outside facility.  He already completed treatment with prolonged IV Zosyn.  After that had fevers again.  CT imaging concerning for septic joint of the left hip/femur with concern for osteomyelitis. Antibiotics and plan as mentioned above.  Continue local wound care.  Has a wound VAC in place.  Fever: Likely secondary to the septic joint.  Antibiotics as mentioned above.  Continue to monitor.  Please monitor BUN/trending very closely while on antibiotics and adjust dose accordingly.  Would also suggest IV hydration as needed to prevent patient from developing renal insufficiency.  UTI: UA showed evidence of UTI.  On antibiotics as mentioned above.  Urine culture showed 20,000 colonies per mL of yeast.  Recent sepsis: Patient had recent sepsis with shock at the outside facility.  Currently shock is resolved.  He is however, high risk for recurrent sepsis because of his wound, septic joint.  Blood cultures from 08/17/2019 do not show any growth.  His previous cultures revealed multiple organisms along with anaerobes. Currently started on IV vancomycin, Zosyn.  Plan as mentioned above.   Diabetes mellitus type 2: Continue to monitor Accu-Cheks, management of diabetes per primary team.  He will need proper glycemic control in order to enable wound healing.  Paraplegia with generalized weakness/debility: Continue supportive management per the primary team, PT/OT.  Protein calorie malnutrition: Management per primary team.  Due to his complex medical problems he is high risk for worsening and decompensation. Plan of care discussed with the primary team and pharmacy.   Subjective: He denies having any major  complaints at this time.  Objective: Vitals: Temperature 97.1, pulse 60, respiratory rate 20, pulse oximetry 100%, blood pressure 115/70  Examination: Constitutional: Alert and awake, oriented x3, not in any acute distress. Eyes: PERLA, EOMI, irises appear normal, anicteric sclera,  ENMT: external ears and nose appear normal, normal hearing, lips appears normal, moist oral mucosa tongue, posterior pharynx appear normal  Neck: neck appears normal, no masses, normal ROM CVS: S1-S2 clear, no murmur Respiratory: Decreased breath sound lower lobes otherwise clear to auscultation Abdomen: Morbidly obese, soft, positive bowel sounds, has ostomy Musculoskeletal: He is paraplegic from his waist down, no lower extremity edema Neuro: Paraplegic from his waist down, also has some upper extremity weakness Psych: judgement and insight appear normal, stable mood and affect, mental status Skin: no rashes    Data Reviewed: I have personally reviewed following labs and imaging studies  CBC: Recent Labs  Lab 08/28/19 1010  WBC 5.5  HGB 8.0*  HCT 26.4*  MCV 86.0  PLT 234    Basic Metabolic Panel: Recent Labs  Lab 08/28/19 0715  NA 139  K 3.6  CL 98  CO2 26  GLUCOSE 185*  BUN 10  CREATININE 0.53*  CALCIUM 8.6*    GFR: CrCl cannot be calculated (Unknown ideal weight.).  Liver Function Tests: No results for input(s): AST, ALT, ALKPHOS, BILITOT, PROT, ALBUMIN in the last 168 hours.  CBG: No results for input(s): GLUCAP in the last 168 hours.   No results found for this or any previous visit (from the past 240 hour(s)).  Radiology Studies: No results found.      Scheduled Meds: Please see MAR   Vonzella Nipple, MD  08/31/2019, 4:03 PM

## 2019-09-03 LAB — CBC
HCT: 31 % — ABNORMAL LOW (ref 39.0–52.0)
Hemoglobin: 9.2 g/dL — ABNORMAL LOW (ref 13.0–17.0)
MCH: 25.4 pg — ABNORMAL LOW (ref 26.0–34.0)
MCHC: 29.7 g/dL — ABNORMAL LOW (ref 30.0–36.0)
MCV: 85.6 fL (ref 80.0–100.0)
Platelets: 215 10*3/uL (ref 150–400)
RBC: 3.62 MIL/uL — ABNORMAL LOW (ref 4.22–5.81)
RDW: 16.3 % — ABNORMAL HIGH (ref 11.5–15.5)
WBC: 5.2 10*3/uL (ref 4.0–10.5)
nRBC: 0 % (ref 0.0–0.2)

## 2019-09-03 LAB — BASIC METABOLIC PANEL
Anion gap: 13 (ref 5–15)
BUN: 10 mg/dL (ref 6–20)
CO2: 24 mmol/L (ref 22–32)
Calcium: 8.7 mg/dL — ABNORMAL LOW (ref 8.9–10.3)
Chloride: 98 mmol/L (ref 98–111)
Creatinine, Ser: 0.63 mg/dL (ref 0.61–1.24)
GFR calc Af Amer: >60 mL/min (ref >60)
GFR calc non Af Amer: >60 mL/min (ref >60)
Glucose, Bld: 209 mg/dL — ABNORMAL HIGH (ref 70–99)
Potassium: 3.4 mmol/L — ABNORMAL LOW (ref 3.5–5.1)
Sodium: 135 mmol/L (ref 135–145)

## 2019-09-04 LAB — VANCOMYCIN, TROUGH: Vancomycin Tr: 20 ug/mL (ref 15–20)

## 2019-09-05 LAB — SEDIMENTATION RATE: Sed Rate: >140 mm/hr — ABNORMAL HIGH (ref 0–16)

## 2019-09-06 LAB — CBC
HCT: 28.5 % — ABNORMAL LOW (ref 39.0–52.0)
Hemoglobin: 8.4 g/dL — ABNORMAL LOW (ref 13.0–17.0)
MCH: 25.1 pg — ABNORMAL LOW (ref 26.0–34.0)
MCHC: 29.5 g/dL — ABNORMAL LOW (ref 30.0–36.0)
MCV: 85.3 fL (ref 80.0–100.0)
Platelets: 205 10*3/uL (ref 150–400)
RBC: 3.34 MIL/uL — ABNORMAL LOW (ref 4.22–5.81)
RDW: 16.3 % — ABNORMAL HIGH (ref 11.5–15.5)
WBC: 4.8 10*3/uL (ref 4.0–10.5)
nRBC: 0 % (ref 0.0–0.2)

## 2019-09-06 LAB — BASIC METABOLIC PANEL
Anion gap: 10 (ref 5–15)
BUN: 11 mg/dL (ref 6–20)
CO2: 26 mmol/L (ref 22–32)
Calcium: 8.4 mg/dL — ABNORMAL LOW (ref 8.9–10.3)
Chloride: 99 mmol/L (ref 98–111)
Creatinine, Ser: 0.61 mg/dL (ref 0.61–1.24)
GFR calc Af Amer: >60 mL/min (ref >60)
GFR calc non Af Amer: >60 mL/min (ref >60)
Glucose, Bld: 183 mg/dL — ABNORMAL HIGH (ref 70–99)
Potassium: 3.6 mmol/L (ref 3.5–5.1)
Sodium: 135 mmol/L (ref 135–145)

## 2019-09-06 LAB — VANCOMYCIN, TROUGH: Vancomycin Tr: 14 ug/mL — ABNORMAL LOW (ref 15–20)

## 2019-09-12 LAB — VANCOMYCIN, TROUGH: Vancomycin Tr: 15 ug/mL (ref 15–20)

## 2019-09-13 NOTE — ED Provider Notes (Signed)
.  Critical Care Performed by: Terald Sleeper, MD Authorized by: Terald Sleeper, MD   Critical care provider statement:    Critical care time (minutes):  45   Critical care was necessary to treat or prevent imminent or life-threatening deterioration of the following conditions:  Circulatory failure   Critical care was time spent personally by me on the following activities:  Discussions with consultants, evaluation of patient's response to treatment, examination of patient, ordering and performing treatments and interventions, ordering and review of laboratory studies, ordering and review of radiographic studies, pulse oximetry, re-evaluation of patient's condition, obtaining history from patient or surrogate and review of old charts Comments:     Symptomatic anemia requiring IV blood transfusion      Terald Sleeper, MD 09/13/19 [phone]

## 2019-09-13 NOTE — Progress Notes (Signed)
PROGRESS NOTE    Donald Graves  JKK:[id number] DOB: 1960-10-18 DOA: 08/06/2019   Brief Narrative:  Donald Graves is an 59 y.o. male with history of paraplegia, chronic Foley catheter, sacral decubitus ulcer, diabetes mellitus type 2, hypertension who initially presented to the acute facility on 06/29/2019 with worsening sacral decubitus ulcer and fatigue.  Patient was found to be septic, fever 102 with elevated leukocytosis, elevated procalcitonin, hypotensive.  He was found to have necrotizing sacral wound infection, gram-negative bacteremia.  His blood cultures showed strep viridans, Corynebacterium, Enterococcus and anaerobes. There was also suspicion for osteomyelitis.  He underwent multiple debridements during his hospital stay on 06/30/2019, 07/10/2019, 4/15 and 4/22.  Infectious disease was consulted for the gram-negative bacteremia and multiple other organisms on the wound cultures including anaerobes. Blood cultures 4/2 one set with Eggerthella lenta, the other with Clostridium hastiforme and Parvimonas micra. UA had >100 WBCs, culture with a sensitive E.coli. He was had an echo that did not reveal any vegetations. He continued to have fevers and underwent repeat blood cultures that were unrevealing. On 4/13, he underwent debridement and diverting colostomy.  Per operative report there was multiple pus pockets and complex necrotic wound with some necrotic fascia, debridement left 30 x 30 cm wound with visible sacrum and posterior wall of rectum and anus. He went back to the OR 4/15 for excision of left trochanteric pressure ulcer and excision of sacral ulcer in preparation for later flap or skin graft.He underwent another surgery on 4/15 and then on 4/22 with further debridement and overall improvement. A PICC line was placed. He was initially on broad antibiotics with Vanc and Zosyn which was narrowed to monotherapy with Zosyn only. Repeat fever later in his course, IV vancomycin was added,  initiated a work up of cultures again and ID was reconsulted. Work up unrevealing and therefore Vancomycin was discontinued.  The plan was to continue treatment with IV Zosyn until 08/10/2019.  After treatment with IV Zosyn he was noted to have a fever of 101.  He had CT of the pelvis with contrast on 08/18/2019 which per report showing large decubitus ulcer with areas of bony resorption around the lesser trochanter as well as posterior aspect of the ischial tuberosity suspicious for osteomyelitis, fluid collection surrounding the proximal left femur likely septic joint.  Interventional radiology was consulted.  Per IR there was not enough fluid to be drained.  He is now restarted on IV vancomycin, Zosyn. 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  Likely extension of the sacral pressure ulcer down to the trochanter of the hip and the ischial tuberosity. He was having fever after completing treatment with IV Zosyn.  Orthopedics was consulted.  They recommended installation wound VAC therapy to debride the necrotic tissue.  IR was also consulted.  Unfortunately not enough fluid to drain. Currently on IV vancomycin, Zosyn with tentative end date 09/15/2019 which would have given him at least 4 to 6 weeks of total treatment as he was already on antibiotics prior to this.  He remains afebrile without any leukocytosis.  He was seen by Dr. Georgiana Shore  of surgery and he did not recommend any further intervention or any further imaging.  Sacral osteomyelitis: Patient had  infected/necrotic stage IV sacral decubitus pressure ulcer and had multiple debridements at the outside facility.  He already completed treatment with prolonged IV Zosyn.  After that had fevers again.  CT imaging on 08/18/2019 concerning for septic joint of the left hip/femur with concern for osteomyelitis. Antibiotics and plan as mentioned above.  Continue local wound care.    Fever: Likely secondary to the septic joint.  Antibiotics as mentioned above.  At this time he is afebrile. Continue to monitor.  Please monitor BUN/trending very closely while on antibiotics and adjust dose accordingly.    UTI: UA showed evidence of UTI.  Urine culture showed 20,000 colonies per mL of yeast.  He is afebrile without any leukocytosis.  If he starts having any worsening fevers or worsening leukocytosis we may need to repeat pancultures.  Recent sepsis: Patient had recent sepsis with shock at the outside facility.  Currently shock is resolved.  He is however, high risk for recurrent sepsis because of his wound, septic joint.  Blood cultures from 08/17/2019 do not show any growth.  His previous cultures revealed multiple organisms along with anaerobes. Currently on IV vancomycin, Zosyn.  Plan as mentioned above.   Diabetes mellitus type 2: Continue to monitor Accu-Cheks, management of diabetes per primary team.  He will need proper glycemic control in order to enable wound healing.  Paraplegia with generalized weakness/debility: Continue supportive management per the primary team, PT/OT.  Protein calorie malnutrition: Management per primary team.  Due to his complex medical problems he is high risk for worsening and decompensation. Plan of care discussed with the primary team and pharmacy.   Subjective: He denies having any major complaints at this time.  Objective: Vitals: Temperature 97.1, pulse 60, respiratory rate 20, pulse oximetry 100%, blood pressure 115/70  Examination: Constitutional: Alert  and awake, oriented x3, not in any acute distress. Eyes: PERLA, EOMI, irises appear normal, anicteric sclera,  ENMT: external ears and nose appear normal, normal hearing, lips appears normal, moist oral mucosa tongue, posterior pharynx appear normal  Neck: neck appears normal, no masses, normal ROM CVS: S1-S2 clear Respiratory: Decreased breath sound lower lobes otherwise clear to auscultation Abdomen: Morbidly obese, soft, positive bowel sounds, has ostomy Musculoskeletal: He is paraplegic from his waist down, no lower extremity edema Neuro: Paraplegic from his waist down, also has some upper extremity weakness Psych: judgement and insight appear normal, stable mood and affect, mental status Skin: no rashes    Data Reviewed: I have personally reviewed following labs and imaging studies  CBC: No results for input(s): WBC, NEUTROABS, HGB, HCT, MCV, PLT in the last 168 hours.  Basic Metabolic Panel: No results for input(s): NA, K, CL, CO2, GLUCOSE, BUN, CREATININE, CALCIUM, MG, PHOS in the last 168 hours.  GFR: CrCl cannot be calculated (Unknown ideal weight.).  Liver Function Tests: No results for input(s): AST, ALT, ALKPHOS, BILITOT, PROT, ALBUMIN in the last 168 hours.  CBG: No results for input(s): GLUCAP in the last 168 hours.   No results found for this or any previous visit (from the past 240 hour(s)).   Radiology Studies: No results found.  Scheduled Meds: Please see MAR   Yaakov Guthrie, MD  09/13/2019, 5:50 PM

## 2019-09-19 LAB — CBC
HCT: 32 % — ABNORMAL LOW (ref 39.0–52.0)
Hemoglobin: 9.7 g/dL — ABNORMAL LOW (ref 13.0–17.0)
MCH: 25.2 pg — ABNORMAL LOW (ref 26.0–34.0)
MCHC: 30.3 g/dL (ref 30.0–36.0)
MCV: 83.1 fL (ref 80.0–100.0)
Platelets: 232 10*3/uL (ref 150–400)
RBC: 3.85 MIL/uL — ABNORMAL LOW (ref 4.22–5.81)
RDW: 16.4 % — ABNORMAL HIGH (ref 11.5–15.5)
WBC: 6.6 10*3/uL (ref 4.0–10.5)
nRBC: 0 % (ref 0.0–0.2)

## 2019-09-19 LAB — BASIC METABOLIC PANEL
Anion gap: 12 (ref 5–15)
BUN: 15 mg/dL (ref 6–20)
CO2: 27 mmol/L (ref 22–32)
Calcium: 9.1 mg/dL (ref 8.9–10.3)
Chloride: 96 mmol/L — ABNORMAL LOW (ref 98–111)
Creatinine, Ser: 0.49 mg/dL — ABNORMAL LOW (ref 0.61–1.24)
GFR calc Af Amer: >60 mL/min (ref >60)
GFR calc non Af Amer: >60 mL/min (ref >60)
Glucose, Bld: 147 mg/dL — ABNORMAL HIGH (ref 70–99)
Potassium: 3.5 mmol/L (ref 3.5–5.1)
Sodium: 135 mmol/L (ref 135–145)

## 2019-09-20 NOTE — Progress Notes (Signed)
PROGRESS NOTE    Donald Graves  ZVJ:[id number] DOB: 10/06/1960 DOA: 08/06/2019   Brief Narrative:  Donald Graves is an 59 y.o. male with history of paraplegia, chronic Foley catheter, sacral decubitus ulcer, diabetes mellitus type 2, hypertension who initially presented to the acute facility on 06/29/2019 with worsening sacral decubitus ulcer and fatigue.  Patient was found to be septic, fever 102 with elevated leukocytosis, elevated procalcitonin, hypotensive.  He was found to have necrotizing sacral wound infection, gram-negative bacteremia.  His blood cultures showed strep viridans, Corynebacterium, Enterococcus and anaerobes. There was also suspicion for osteomyelitis.  He underwent multiple debridements during his hospital stay on 06/30/2019, 07/10/2019, 4/15 and 4/22.  He had multiple organisms on the wound cultures including anaerobes. Blood cultures 4/2 one set with Eggerthella lenta, the other with Clostridium hastiforme and Parvimonas micra. UA had >100 WBCs, culture with a sensitive E.coli. He was had an echo that did not reveal any vegetations. He continued to have fevers and underwent repeat blood cultures that were unrevealing. On 4/13, he underwent debridement and diverting colostomy.  Per operative report there was multiple pus pockets and complex necrotic wound with some necrotic fascia, debridement left 30 x 30 cm wound with visible sacrum and posterior wall of rectum and anus. He went back to the OR 4/15 for excision of left trochanteric pressure ulcer and excision of sacral ulcer in preparation for later flap or skin graft.He underwent another surgery on 4/15 and then on 4/22 with further debridement and overall improvement. A PICC line was placed. He was initially on broad antibiotics with Vanc and Zosyn which was narrowed to monotherapy with Zosyn only. Repeat fever later in his course, IV vancomycin was added, initiated a work up of cultures again and ID was reconsulted. Work up  unrevealing and therefore Vancomycin was discontinued.  The plan was to continue treatment with IV Zosyn until 08/10/2019.  After treatment with IV Zosyn he was noted to have a fever of 101.  He had CT of the pelvis with contrast on 08/18/2019 which per report showing large decubitus ulcer with areas of bony resorption around the lesser trochanter as well as posterior aspect of the ischial tuberosity suspicious for osteomyelitis, fluid collection surrounding the proximal left femur likely septic joint.  Interventional radiology was consulted.  Per IR there was not enough fluid to be drained.  He was again restarted on IV vancomycin, Zosyn. 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  Likely extension of the sacral pressure ulcer down to the trochanter of the hip and the ischial tuberosity. He was having fever after completing treatment with IV Zosyn.  Orthopedics was consulted.  They recommended installation wound VAC therapy to debride the necrotic tissue.  IR was also consulted.  Unfortunately not enough fluid to drain.  He has received treatment with IV vancomycin, Zosyn which he recently completed.  He was seen by Dr. Georgiana Shore of surgery and he did not recommend any further intervention or any further imaging. 09/20/2019: Patient noted to have low-grade fevers with fever spike of 101. He is currently without any leukocytosis.  Blood cultures from 09/18/2019 preliminary report does not show any growth. -He has already had prolonged antibiotic  therapy.  Would recommend to monitor at this time.  If he is continuing to have fevers then would also recommend to send for urine cultures because he has chronic Foley catheter secondary to neurogenic bladder from the paraplegia.  Would also recommend to remove the PICC line. -If he still having fevers despite the above measures we may need to consider starting empiric antibiotics and repeat imaging. -Also if he starts having worsening diarrhea would recommend to send stool for C. difficile since he is high risk for C. difficile given the prolonged antibiotic therapy.  Sacral osteomyelitis: Patient had infected/necrotic stage IV sacral decubitus pressure ulcer and had multiple debridements at the outside facility.  He already completed treatment with prolonged IV Zosyn.  After that had fevers again.  CT imaging on 08/18/2019 concerning for septic joint of the left hip/femur with concern for osteomyelitis.  He has been on prolonged antibiotic therapy and recently completed treatment with IV vancomycin, Zosyn. Plan as mentioned above.  Continue local wound care.  If the wound is worsening we may need to consider repeat imaging.  Fever: He recently completed treatment with antibiotics.  Noted to have fever of 101 after the antibiotics.   Patient is unfortunately bedbound secondary to paraplegia.  They could be a number of reasons for the fever.  Preliminary blood cultures do not show any growth. Would recommend to remove the PICC line to give a line holiday. -He also has neurogenic bladder and has a chronic Foley catheter therefore at risk for UTI.  If he is continuing to have fevers would recommend to change a Foley catheter and send for UA and urine cultures.  If UA showing evidence of UTI consider starting him on empiric antibiotics. -He is also high risk for atelectasis and pneumonia.  Also suggest chest x-ray to evaluate for this.  At this time he denies having any respiratory symptoms.  Consider  incentive spirometry as tolerated. -If he starts having any worsening diarrhea would recommend to send stool for C. difficile because he is high risk for C. difficile infection. -Despite all these measures if his fevers are continuing then we may not have much choice but to restart him on antibiotics and repeat CT imaging.  UTI: Previous UA showed evidence of UTI.  Urine culture showed 20,000 colonies per mL of yeast.  He is paraplegic, has neurogenic bladder and chronic Foley catheter therefore high risk for recurrent UTI.  If he is continuing to have fevers or worsening leukocytosis we may need to change the urinary catheter and repeat UA and urine cultures.  Recent sepsis: Patient had recent sepsis with shock at the outside facility.  Currently shock is resolved.  He is however, high risk for recurrent sepsis because of his wound, septic joint.  Blood cultures from 08/17/2019 do not show any growth.  His previous cultures revealed multiple organisms along with anaerobes.  Recently completed IV vancomycin, Zosyn.  Now having fevers again but blood pressure stable at this time.  Repeat blood cultures from 09/18/2019 did not show any growth to date.  Continue to monitor closely. Plan as mentioned above.   Diabetes mellitus type 2: Continue to monitor Accu-Cheks, management of diabetes per primary team.  He will need proper glycemic control in order to enable wound healing.  Paraplegia with generalized weakness/debility: Continue supportive management per the primary team, PT/OT.  Protein calorie malnutrition: Management per  primary team.  Due to his complex medical problems he is high risk for worsening and decompensation. Plan of care discussed with the primary team and pharmacy.   Subjective: He denies having any major complaints at this time.  Objective: Vitals: Temperature 98.2, pulse 89, respiratory rate 20, pulse oximetry 100%, blood pressure 125/63  Examination: Constitutional:  Alert and awake, oriented x3, not in any acute distress. Eyes: PERLA, EOMI, irises appear normal, anicteric sclera,  ENMT: external ears and nose appear normal, normal hearing, lips appears normal, moist oral mucosa, tongue normal  Neck: neck appears normal, no masses, normal ROM CVS: S1-S2 clear Respiratory: Decreased breath sound lower lobes otherwise clear to auscultation Abdomen: Morbidly obese, soft, positive bowel sounds, has ostomy Musculoskeletal: He is paraplegic from his waist down, no lower extremity edema Neuro: Paraplegic from his waist down, also has some upper extremity weakness Psych: judgement and insight appear normal, stable mood and affect, mental status Skin: no rashes    Data Reviewed: I have personally reviewed following labs and imaging studies  CBC: Recent Labs  Lab 09/19/19 0420  WBC 6.6  HGB 9.7*  HCT 32.0*  MCV 83.1  PLT 229    Basic Metabolic Panel: Recent Labs  Lab 09/19/19 0420  NA 135  K 3.5  CL 96*  CO2 27  GLUCOSE 147*  BUN 15  CREATININE 0.49*  CALCIUM 9.1    GFR: CrCl cannot be calculated (Unknown ideal weight.).  Liver Function Tests: No results for input(s): AST, ALT, ALKPHOS, BILITOT, PROT, ALBUMIN in the last 168 hours.  CBG: No results for input(s): GLUCAP in the last 168 hours.   Recent Results (from the past 240 hour(s))  Culture, blood (routine x 2)     Status: None (Preliminary result)   Collection Time: 09/18/19  3:40 PM   Specimen: BLOOD LEFT HAND  Result Value Ref Range Status   Specimen Description BLOOD LEFT HAND  Final   Special Requests   Final    BOTTLES DRAWN AEROBIC ONLY Blood Culture adequate volume   Culture   Final    NO GROWTH 2 DAYS Performed at [facility], [address], [place], [place] [address]    Report Status PENDING  Incomplete  Culture, blood (routine x 2)     Status: None (Preliminary result)   Collection Time: 09/18/19  3:40 PM   Specimen: BLOOD LEFT HAND  Result Value Ref  Range Status   Specimen Description BLOOD LEFT HAND  Final   Special Requests   Final    BOTTLES DRAWN AEROBIC ONLY Blood Culture adequate volume   Culture   Final    NO GROWTH 2 DAYS Performed at [facility], [place] [address], [place], [place] [address]    Report Status PENDING  Incomplete     Radiology Studies: No results found.  Scheduled Meds: Please see MAR   Yaakov Guthrie, MD  09/20/2019, 2:12 PM

## 2019-09-21 ENCOUNTER — Other Ambulatory Visit (HOSPITAL_COMMUNITY): Payer: Medicare HMO

## 2019-09-22 LAB — BASIC METABOLIC PANEL
Anion gap: 12 (ref 5–15)
BUN: 12 mg/dL (ref 6–20)
CO2: 26 mmol/L (ref 22–32)
Calcium: 8.6 mg/dL — ABNORMAL LOW (ref 8.9–10.3)
Chloride: 94 mmol/L — ABNORMAL LOW (ref 98–111)
Creatinine, Ser: 0.62 mg/dL (ref 0.61–1.24)
GFR calc Af Amer: >60 mL/min (ref >60)
GFR calc non Af Amer: >60 mL/min (ref >60)
Glucose, Bld: 256 mg/dL — ABNORMAL HIGH (ref 70–99)
Potassium: 3.7 mmol/L (ref 3.5–5.1)
Sodium: 132 mmol/L — ABNORMAL LOW (ref 135–145)

## 2019-09-22 LAB — CBC
HCT: 31.7 % — ABNORMAL LOW (ref 39.0–52.0)
Hemoglobin: 9.6 g/dL — ABNORMAL LOW (ref 13.0–17.0)
MCH: 24.9 pg — ABNORMAL LOW (ref 26.0–34.0)
MCHC: 30.3 g/dL (ref 30.0–36.0)
MCV: 82.1 fL (ref 80.0–100.0)
Platelets: 214 10*3/uL (ref 150–400)
RBC: 3.86 MIL/uL — ABNORMAL LOW (ref 4.22–5.81)
RDW: 16.1 % — ABNORMAL HIGH (ref 11.5–15.5)
WBC: 6.4 10*3/uL (ref 4.0–10.5)
nRBC: 0 % (ref 0.0–0.2)

## 2019-09-23 LAB — CULTURE, BLOOD (ROUTINE X 2)
Culture: NO GROWTH
Culture: NO GROWTH
Special Requests: ADEQUATE
Special Requests: ADEQUATE

## 2019-09-28 LAB — BASIC METABOLIC PANEL
Anion gap: 11 (ref 5–15)
BUN: 14 mg/dL (ref 6–20)
CO2: 25 mmol/L (ref 22–32)
Calcium: 8.6 mg/dL — ABNORMAL LOW (ref 8.9–10.3)
Chloride: 97 mmol/L — ABNORMAL LOW (ref 98–111)
Creatinine, Ser: 0.45 mg/dL — ABNORMAL LOW (ref 0.61–1.24)
GFR calc Af Amer: >60 mL/min (ref >60)
GFR calc non Af Amer: >60 mL/min (ref >60)
Glucose, Bld: 110 mg/dL — ABNORMAL HIGH (ref 70–99)
Potassium: 3.6 mmol/L (ref 3.5–5.1)
Sodium: 133 mmol/L — ABNORMAL LOW (ref 135–145)

## 2019-09-28 LAB — CBC
HCT: 29.9 % — ABNORMAL LOW (ref 39.0–52.0)
Hemoglobin: 9 g/dL — ABNORMAL LOW (ref 13.0–17.0)
MCH: 24.3 pg — ABNORMAL LOW (ref 26.0–34.0)
MCHC: 30.1 g/dL (ref 30.0–36.0)
MCV: 80.6 fL (ref 80.0–100.0)
Platelets: 226 10*3/uL (ref 150–400)
RBC: 3.71 MIL/uL — ABNORMAL LOW (ref 4.22–5.81)
RDW: 16.1 % — ABNORMAL HIGH (ref 11.5–15.5)
WBC: 6.4 10*3/uL (ref 4.0–10.5)
nRBC: 0 % (ref 0.0–0.2)

## 2019-10-03 LAB — SARS CORONAVIRUS 2 (TAT 6-24 HRS): SARS Coronavirus 2: NEGATIVE

## 2020-12-27 DEATH — deceased

## 2021-01-19 IMAGING — CT CT PELVIS W/ CM
2 of 6 series · 13 of 46 positions shown, 15 images · IV contrast (omnipaque)
Comparison: None.

CLINICAL DATA: Sacral wound

EXAM:
CT PELVIS WITH CONTRAST
TECHNIQUE: Multidetector CT imaging of the pelvis was performed using the
standard protocol following the bolus administration of intravenous
contrast.
CONTRAST:  100mL OMNIPAQUE IOHEXOL 300 MG/ML  SOLN

[Series 5: pelvis thin · axial · 0.96mm/px · z∈[+790,+1055]mm · 10 of 542 slices shown, 12 images]
[im 50/542  soft-tissue]
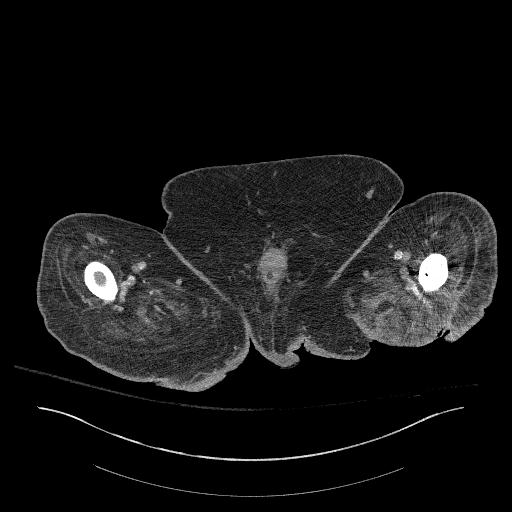
[im 50/542  bone]
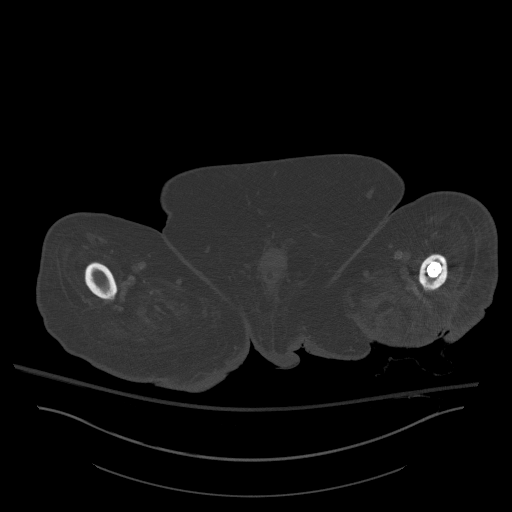
[im 99/542  soft-tissue]
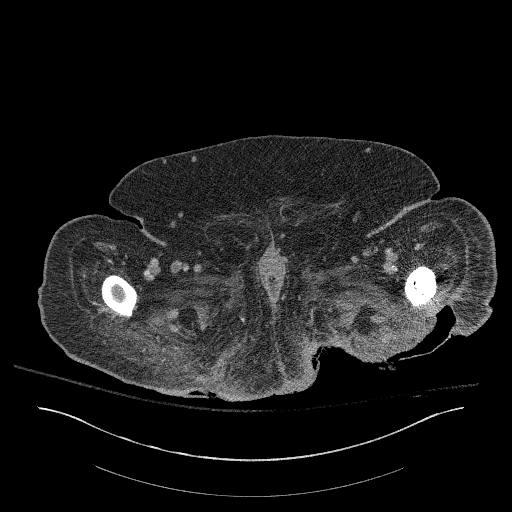
[im 148/542  soft-tissue]
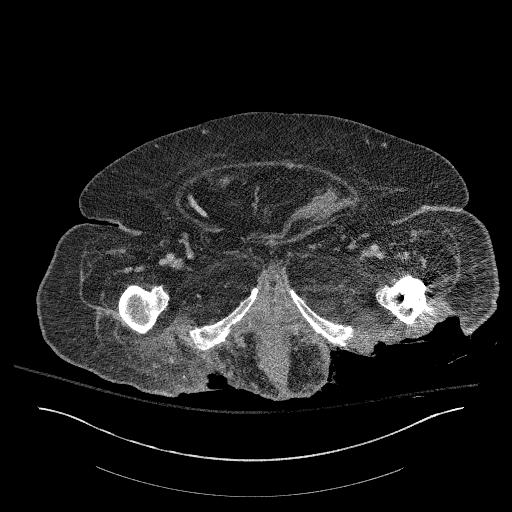
[im 197/542  soft-tissue]
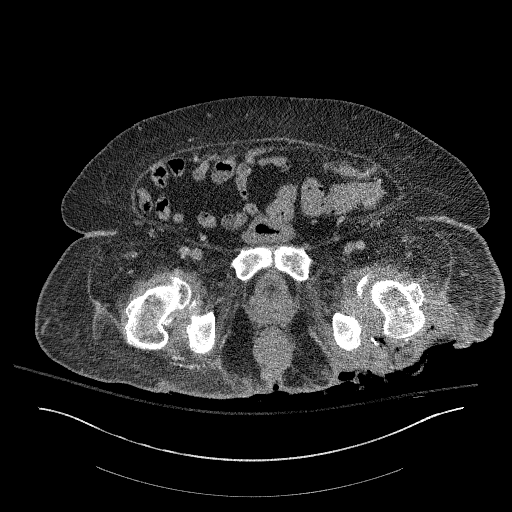
[im 246/542  soft-tissue]
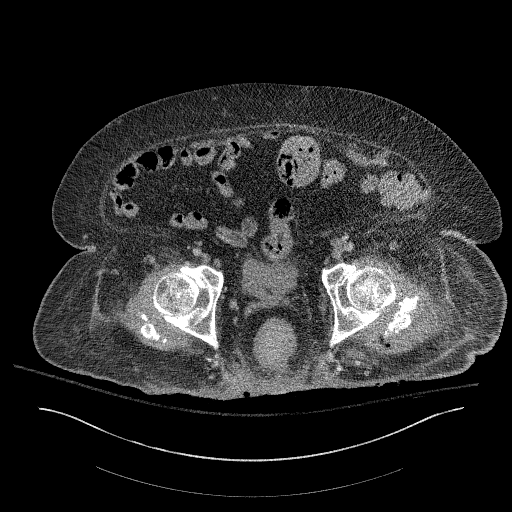
[im 296/542  soft-tissue]
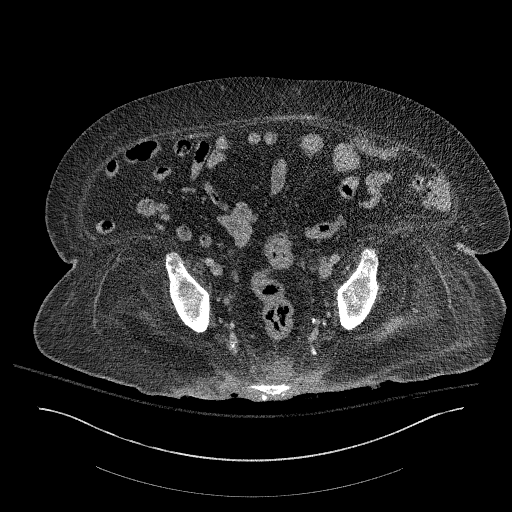
[im 345/542  soft-tissue]
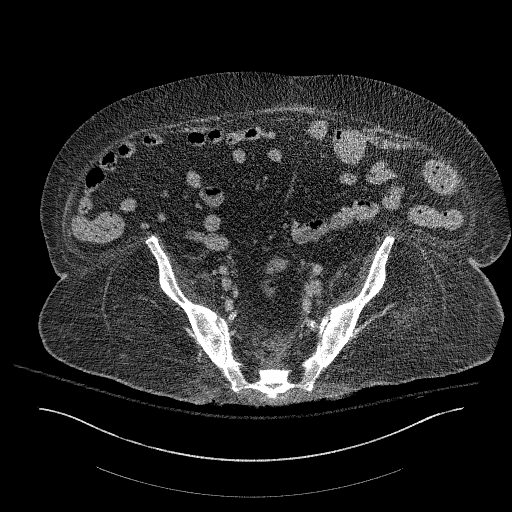
[im 394/542  soft-tissue]
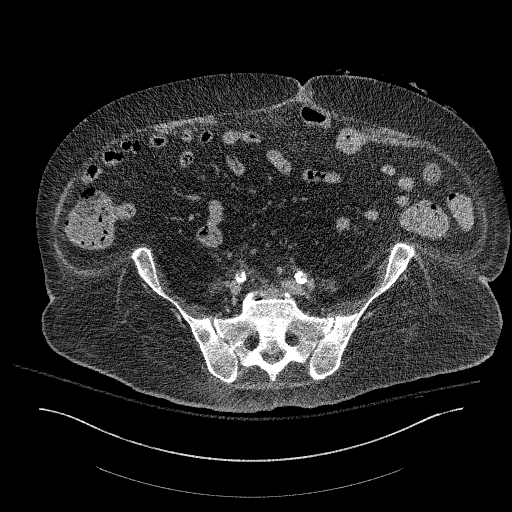
[im 443/542  soft-tissue]
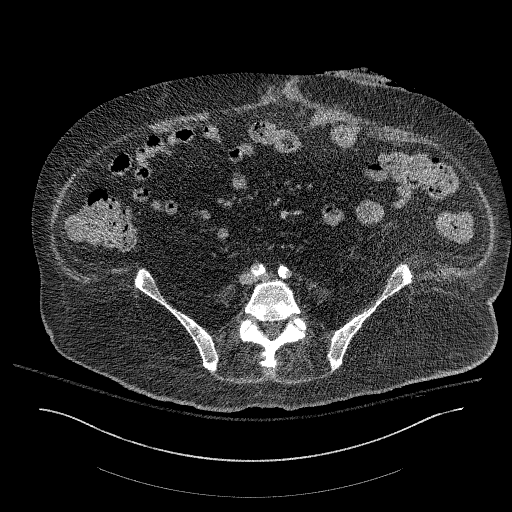
[im 443/542  bone]
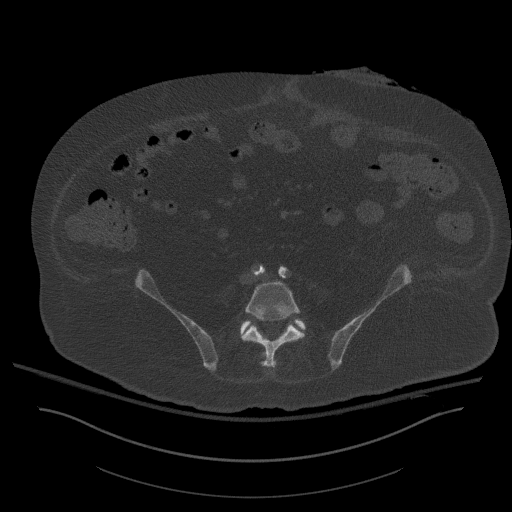
[im 492/542  soft-tissue]
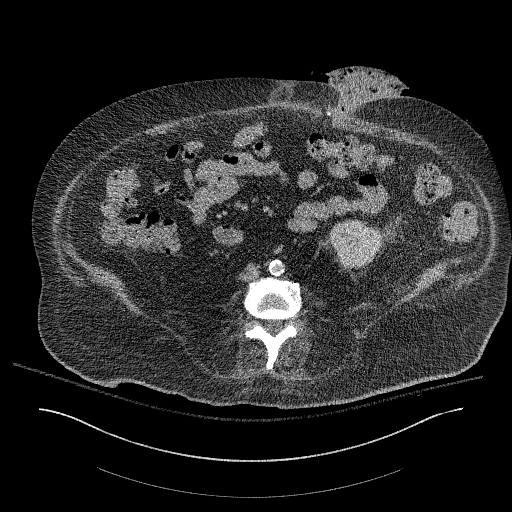

[Series 10: coronal st · coronal · 0.70mm/px · 3 of 184 slices shown]
[im 62/184  soft-tissue]
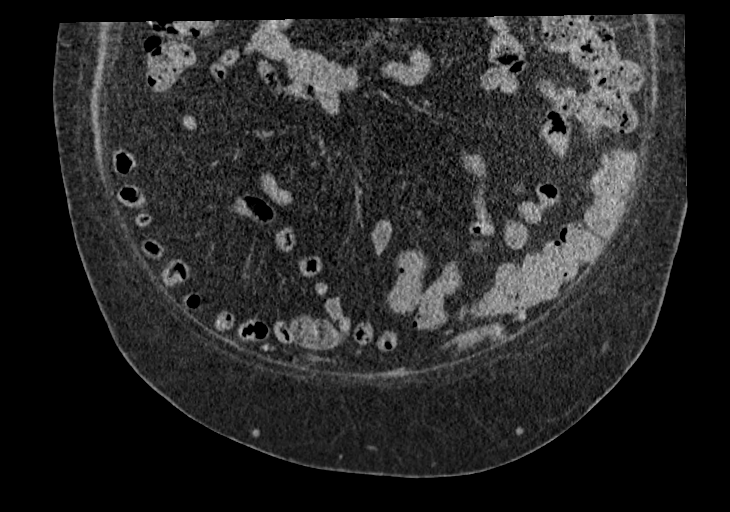
[im 82/184  soft-tissue]
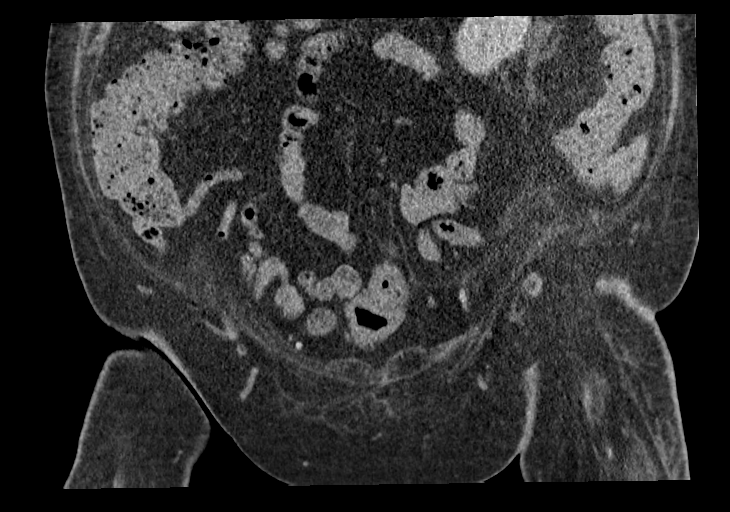
[im 102/184  soft-tissue]
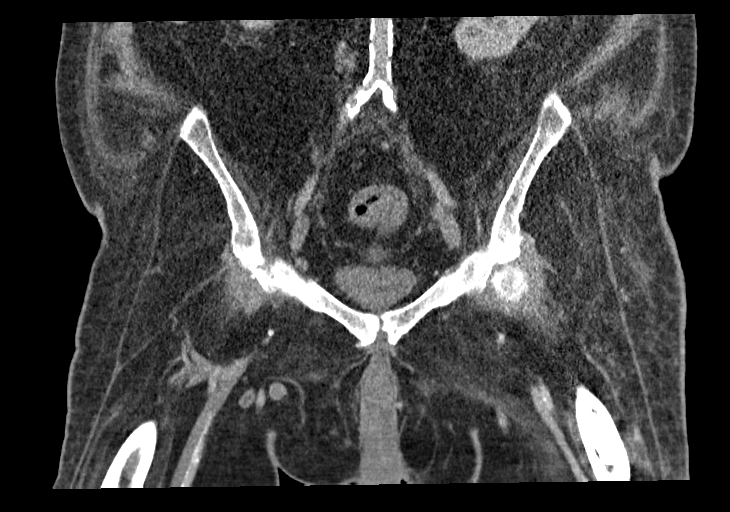

[13 of 46 positions shown; findings below may reference images not displayed]

FINDINGS: Urinary Tract: Bladder is decompressed by Foley catheter. Visualized
kidneys demonstrate no focal abnormality. No obstructive changes are
seen.

Bowel: There are changes consistent with colostomy on the left. No
obstructive or inflammatory changes are seen. The appendix is within
normal limits.

Vascular/Lymphatic: Atherosclerotic calcifications are noted. No
aneurysmal dilatation is seen. No lymphadenopathy is noted.

Reproductive:  Prostate is within normal limits.

Other:  No free fluid is noted.

Musculoskeletal: Postsurgical changes are noted in the left hip.
Mild bony resorption is noted along the posterior aspect of the
proximal left femur in the intratrochanteric region as well as
within the ischial tuberosity posteriorly. Additionally some fluid
is noted surrounding the proximal femur. Air is noted along the
lesser trochanter posteriorly. Some calcifications are noted which
may represent some bony fragmentation. Large decubitus ulcer is
noted posteriorly extending to both the margin of the left ischial
tuberosity as well as the proximal left femur.
IMPRESSION: Large decubitus ulcer with areas of bony resorption along the lesser
trochanter as well as the posterior aspect of the ischial tuberosity
on the left. These changes are highly suspicious for osteomyelitis.

Fluid collection surrounding the proximal left femur likely
representing septic joint.

## 2021-01-26 IMAGING — DX DG CHEST 1V PORT
1 series · 1 of 1 positions shown · non-contrast
Comparison: None.

CLINICAL DATA: PICC line

EXAM:
PORTABLE CHEST 1 VIEW

[chest]
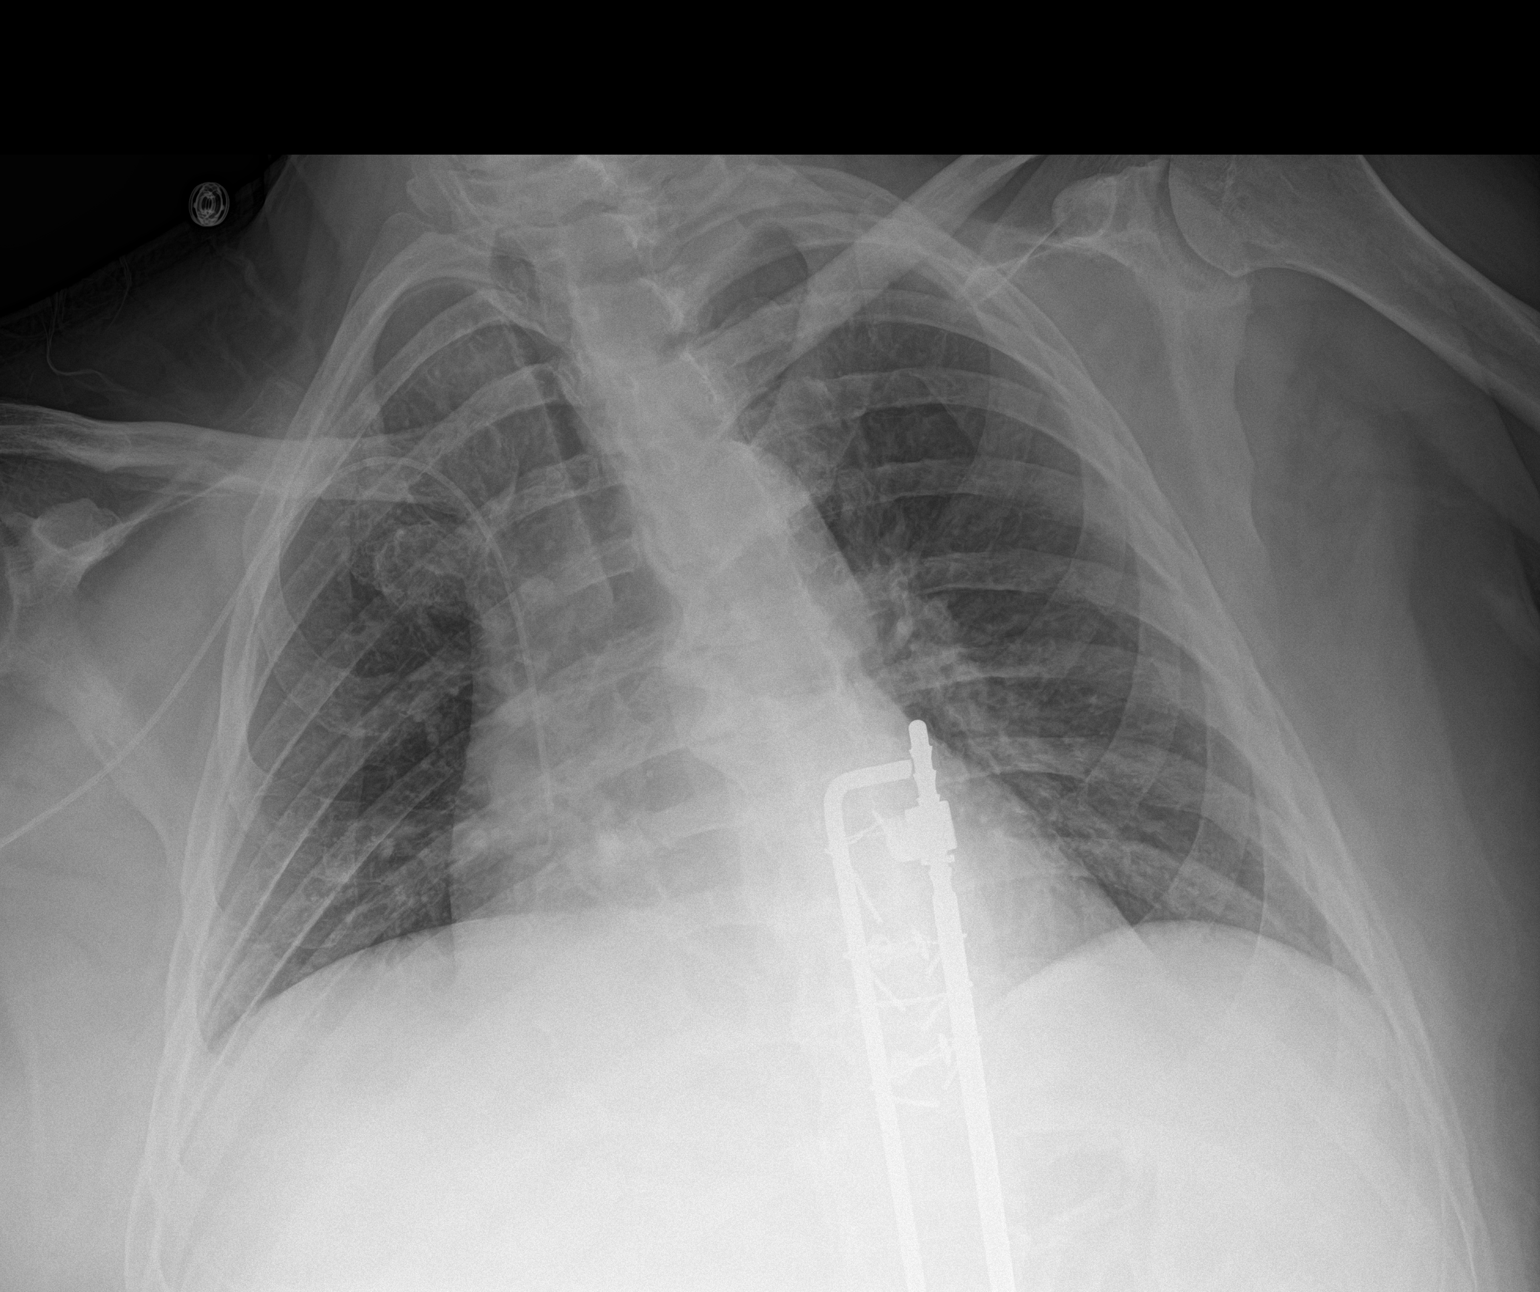

[1 of 1 positions shown; findings below may reference images not displayed]

FINDINGS: PICC line with tip in distal SVC. Normal cardiac silhouette.
Elevated RIGHT hemidiaphragm. Posterior lumbar fusion rod.
IMPRESSION: RIGHT PICC line appears in good position.

## 2021-02-22 IMAGING — CT CT ABD-PELV W/O CM
2 of 4 series · 15 of 46 positions shown, 17 images · non-contrast
Comparison: CT abdomen pelvis dated 08/18/2019.

CLINICAL DATA: 59-year-old male with diabetes and sepsis. Concern
for osteomyelitis of the left hip.

EXAM:
CT ABDOMEN AND PELVIS WITHOUT CONTRAST
TECHNIQUE: Multidetector CT imaging of the abdomen and pelvis was performed
following the standard protocol without IV contrast.

[Series 3: ap without (person_name) · axial · non-contrast · 0.96mm/px · z∈[-455,-15]mm · 12 of 100 slices shown, 14 images]
[im 6/100  soft-tissue]
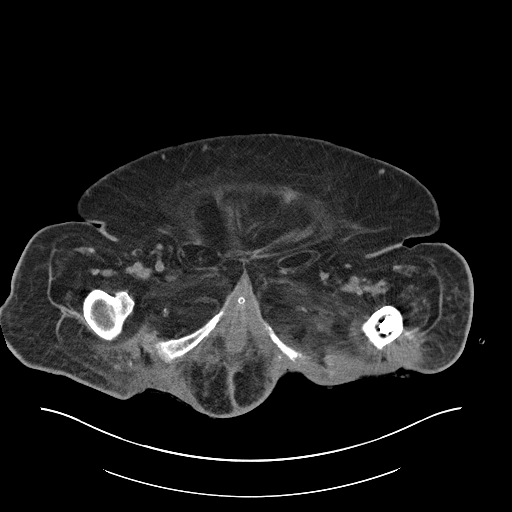
[im 6/100  bone]
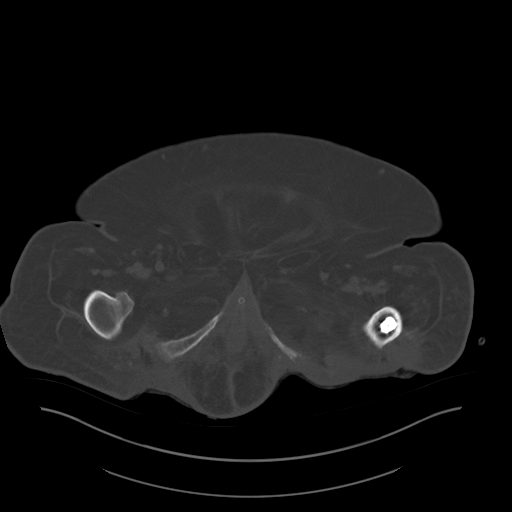
[im 16/100  soft-tissue]
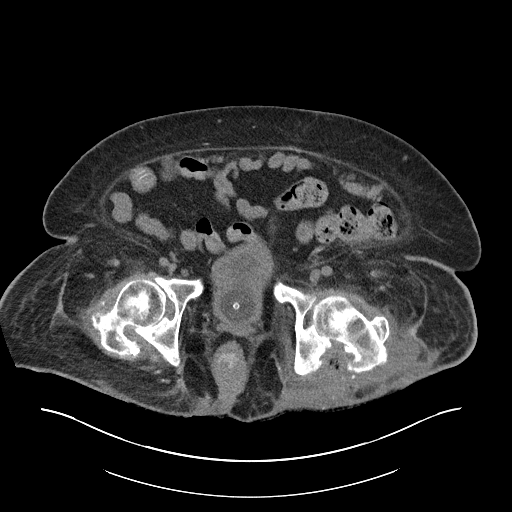
[im 21/100  soft-tissue]
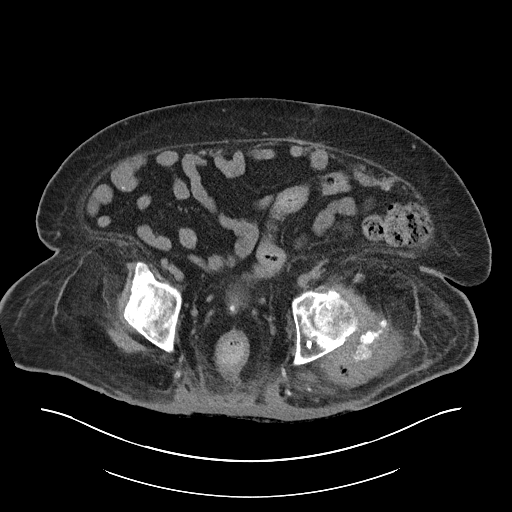
[im 32/100  soft-tissue]
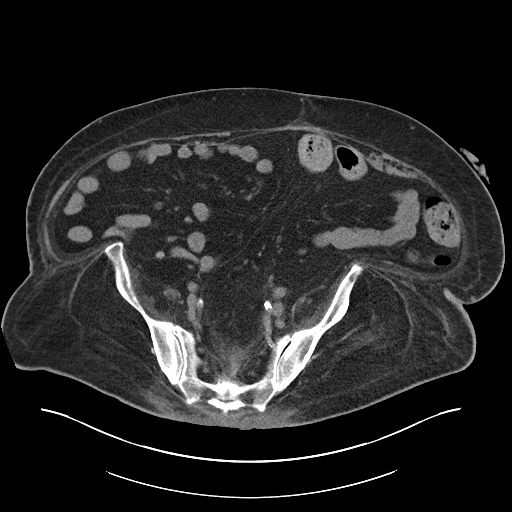
[im 37/100  soft-tissue]
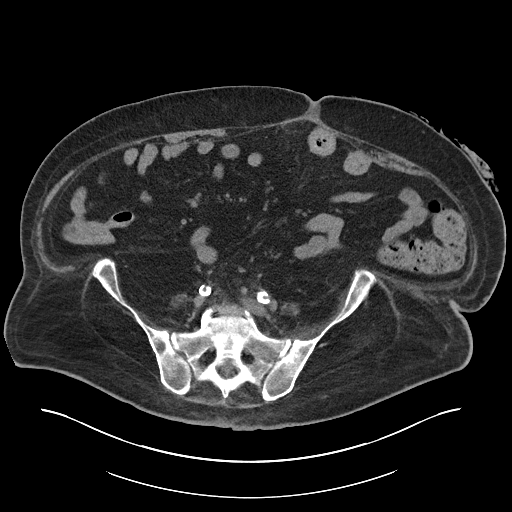
[im 47/100  soft-tissue]
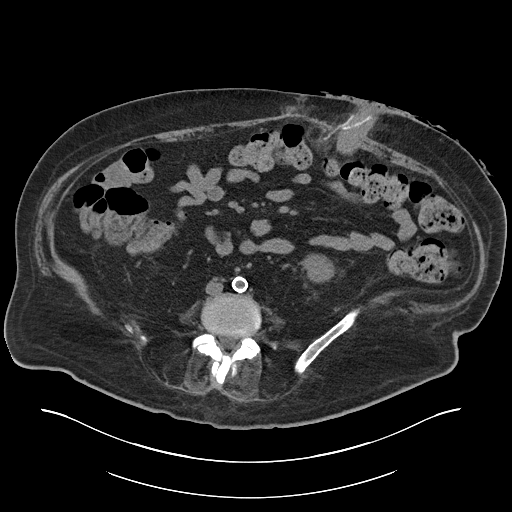
[im 53/100  soft-tissue]
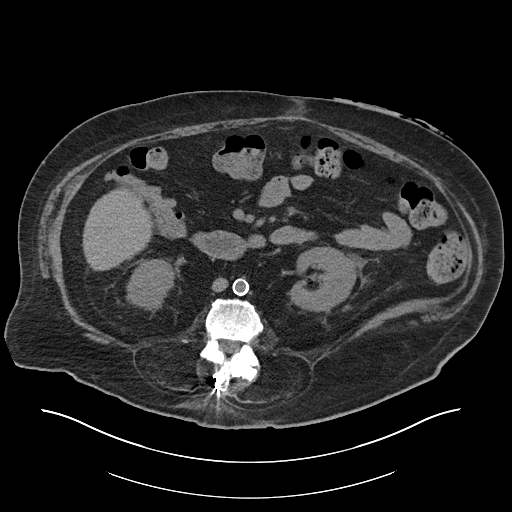
[im 63/100  soft-tissue]
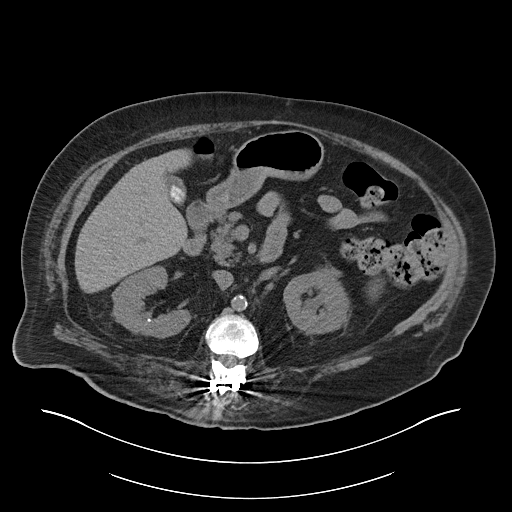
[im 68/100  soft-tissue]
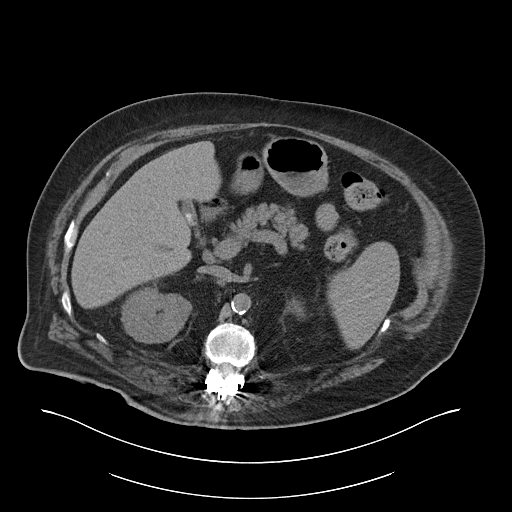
[im 68/100  bone]
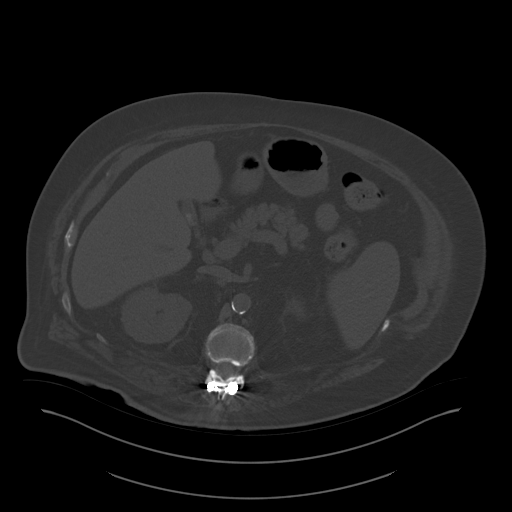
[im 79/100  soft-tissue]
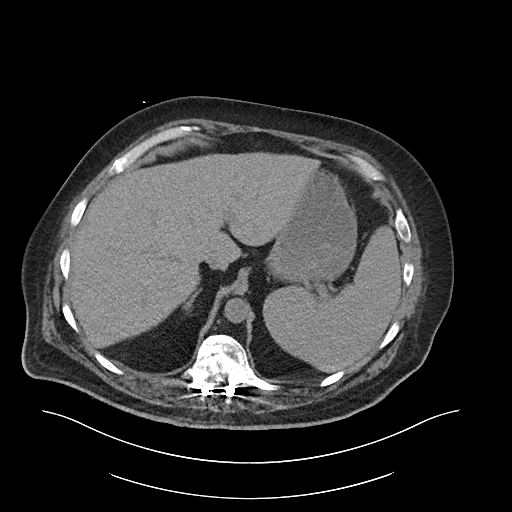
[im 84/100  soft-tissue]
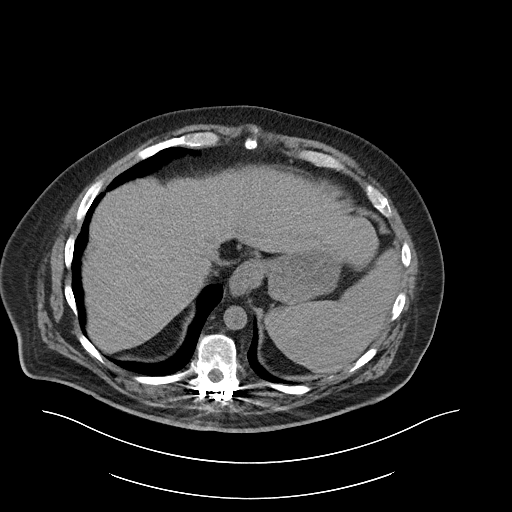
[im 94/100  soft-tissue]
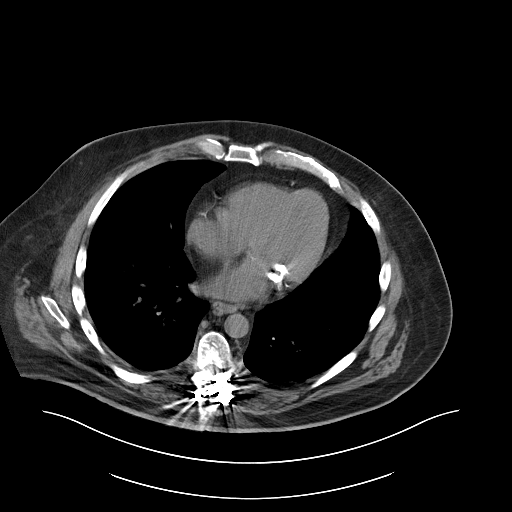

[Series 6: (person_name) · coronal · 1.01mm/px · 3 of 127 slices shown]
[im 43/127  soft-tissue]
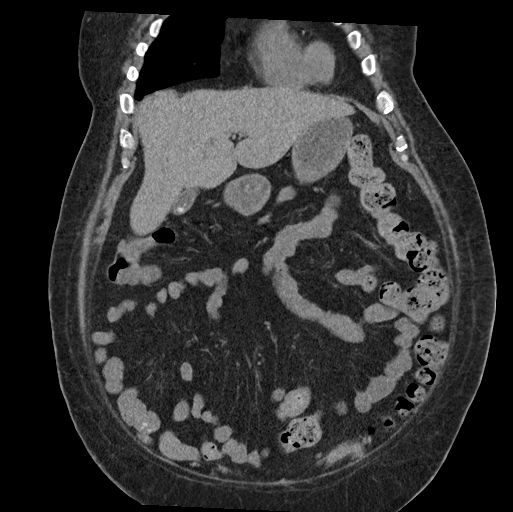
[im 57/127  soft-tissue]
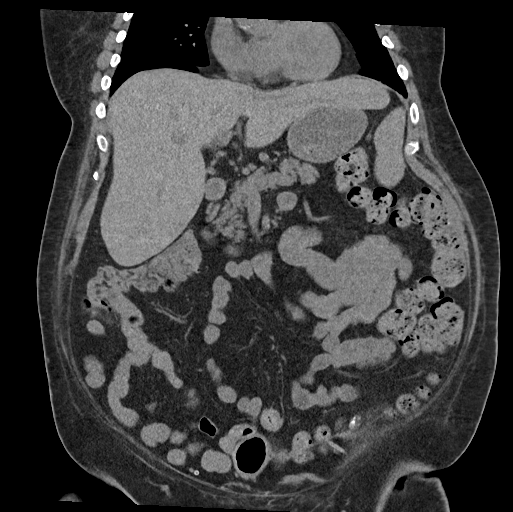
[im 71/127  soft-tissue]
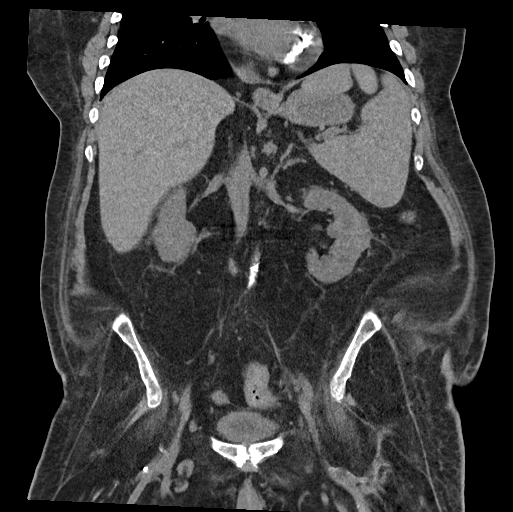

[15 of 46 positions shown; findings below may reference images not displayed]

FINDINGS: Evaluation of this exam is limited in the absence of intravenous
contrast.

Lower chest: The visualized lung bases are clear. Coronary vascular
calcification as well as calcification of the mitral annulus.

No intra-abdominal free air or free fluid.

Hepatobiliary: The liver is unremarkable. No intrahepatic biliary
ductal dilatation. There multiple stones within the gallbladder. No
pericholecystic fluid or evidence of acute cholecystitis by CT.

Pancreas: Unremarkable. No pancreatic ductal dilatation or
surrounding inflammatory changes.

Spleen: Normal in size without focal abnormality.

Adrenals/Urinary Tract: The adrenal glands unremarkable. Several
small nonobstructing bilateral renal calculi measure up to 3 mm in
the upper pole the right kidney. There is no hydronephrosis on
either side. The visualized ureters are unremarkable. The urinary
bladder is decompressed around a Foley catheter. Air within the
urinary bladder likely introduced via the catheter.

Stomach/Bowel: There is postsurgical changes of the bowel with a
left lower quadrant diverting colostomy. There is moderate stool
throughout the colon. There is no bowel obstruction or active
inflammation. The appendix is normal.

Vascular/Lymphatic: Advanced aortoiliac atherosclerotic disease. The
IVC is unremarkable. No portal venous gas. There is no adenopathy.

Reproductive: The prostate is not well visualized.

Other: None

Musculoskeletal: There is soft tissue thickening and a large left
decubitus ulcer posterior to the left hip extending to the level of
the bone at the left ischial tuberosity and posterior margin of the
greater trochanter of the left femur. Mild cortical irregularity of
the left ischial tuberosity and greater trochanter of the left femur
similar to prior CT concerning for osteomyelitis. Clinical
correlation is recommended. MRI may provide better evaluation.
Overall no significant interval change since the CT of 08/18/2019.
Small pockets of soft tissue air noted posterior to the left hip
joint. A left femoral intramedullary nail is seen. There is no acute
fracture or dislocation. No drainable fluid collection or abscess.
Posterior spinal fusion hardware.
IMPRESSION: 1. No acute intra-abdominal or pelvic pathology.
2. Large left decubitus ulcer with findings concerning for
osteomyelitis of the left ischial tuberosity and greater trochanter
of the left femur similar to prior CT. Clinical correlation is
recommended. MRI may provide better evaluation. No drainable fluid
collection or abscess.
3. Cholelithiasis.
4. Small nonobstructing bilateral renal calculi. No hydronephrosis.
5. Postsurgical changes of the bowel with a left lower quadrant
diverting colostomy. No bowel obstruction. Normal appendix.
6. Aortic Atherosclerosis ([id number]).
